# Patient Record
Sex: Female | Born: 1935 | Race: White | Hispanic: No | Marital: Married | Smoking: Former smoker
Health system: Southern US, Community
[De-identification: ages and names within clinical notes are randomized; demographics above are authoritative.]

## PROBLEM LIST (undated history)

## (undated) DIAGNOSIS — J449 Chronic obstructive pulmonary disease, unspecified: Secondary | ICD-10-CM

## (undated) DIAGNOSIS — M797 Fibromyalgia: Secondary | ICD-10-CM

## (undated) DIAGNOSIS — I4891 Unspecified atrial fibrillation: Secondary | ICD-10-CM

## (undated) DIAGNOSIS — K219 Gastro-esophageal reflux disease without esophagitis: Secondary | ICD-10-CM

## (undated) DIAGNOSIS — I10 Essential (primary) hypertension: Secondary | ICD-10-CM

## (undated) DIAGNOSIS — E119 Type 2 diabetes mellitus without complications: Secondary | ICD-10-CM

## (undated) DIAGNOSIS — F32A Depression, unspecified: Secondary | ICD-10-CM

## (undated) DIAGNOSIS — F329 Major depressive disorder, single episode, unspecified: Secondary | ICD-10-CM

## (undated) DIAGNOSIS — J441 Chronic obstructive pulmonary disease with (acute) exacerbation: Secondary | ICD-10-CM

## (undated) DIAGNOSIS — R413 Other amnesia: Secondary | ICD-10-CM

## (undated) DIAGNOSIS — H409 Unspecified glaucoma: Secondary | ICD-10-CM

## (undated) DIAGNOSIS — J4489 Other specified chronic obstructive pulmonary disease: Secondary | ICD-10-CM

## (undated) DIAGNOSIS — E7800 Pure hypercholesterolemia, unspecified: Secondary | ICD-10-CM

---

## 2015-06-10 ENCOUNTER — Other Ambulatory Visit: Payer: Self-pay | Admitting: Unknown Physician Specialty

## 2015-06-10 DIAGNOSIS — M1711 Unilateral primary osteoarthritis, right knee: Secondary | ICD-10-CM

## 2015-06-25 ENCOUNTER — Ambulatory Visit
Admission: RE | Admit: 2015-06-25 | Discharge: 2015-06-25 | Disposition: A | Discharge status: Home / Self Care | Payer: Medicare Other | Source: Ambulatory Visit | Attending: Unknown Physician Specialty | Admitting: Unknown Physician Specialty

## 2015-06-25 DIAGNOSIS — X58XXXA Exposure to other specified factors, initial encounter: Secondary | ICD-10-CM | POA: Diagnosis not present

## 2015-06-25 DIAGNOSIS — S83231A Complex tear of medial meniscus, current injury, right knee, initial encounter: Secondary | ICD-10-CM | POA: Diagnosis not present

## 2015-06-25 DIAGNOSIS — M1711 Unilateral primary osteoarthritis, right knee: Secondary | ICD-10-CM | POA: Diagnosis not present

## 2016-09-23 ENCOUNTER — Emergency Department: Payer: Medicare Other

## 2016-09-23 ENCOUNTER — Emergency Department
Admission: EM | Admit: 2016-09-23 | Discharge: 2016-09-23 | Disposition: A | Discharge status: Home / Self Care | Payer: Medicare Other | Attending: Emergency Medicine | Admitting: Emergency Medicine

## 2016-09-23 ENCOUNTER — Encounter: Payer: Self-pay | Admitting: *Deleted

## 2016-09-23 DIAGNOSIS — E119 Type 2 diabetes mellitus without complications: Secondary | ICD-10-CM | POA: Insufficient documentation

## 2016-09-23 DIAGNOSIS — I10 Essential (primary) hypertension: Secondary | ICD-10-CM | POA: Diagnosis not present

## 2016-09-23 DIAGNOSIS — S0990XA Unspecified injury of head, initial encounter: Secondary | ICD-10-CM

## 2016-09-23 DIAGNOSIS — Y9301 Activity, walking, marching and hiking: Secondary | ICD-10-CM | POA: Insufficient documentation

## 2016-09-23 DIAGNOSIS — S32592A Other specified fracture of left pubis, initial encounter for closed fracture: Secondary | ICD-10-CM | POA: Diagnosis not present

## 2016-09-23 DIAGNOSIS — S79912A Unspecified injury of left hip, initial encounter: Secondary | ICD-10-CM | POA: Diagnosis not present

## 2016-09-23 DIAGNOSIS — Y92129 Unspecified place in nursing home as the place of occurrence of the external cause: Secondary | ICD-10-CM | POA: Diagnosis not present

## 2016-09-23 DIAGNOSIS — W01198A Fall on same level from slipping, tripping and stumbling with subsequent striking against other object, initial encounter: Secondary | ICD-10-CM | POA: Insufficient documentation

## 2016-09-23 DIAGNOSIS — S4992XA Unspecified injury of left shoulder and upper arm, initial encounter: Secondary | ICD-10-CM | POA: Insufficient documentation

## 2016-09-23 DIAGNOSIS — Y999 Unspecified external cause status: Secondary | ICD-10-CM | POA: Insufficient documentation

## 2016-09-23 DIAGNOSIS — W19XXXA Unspecified fall, initial encounter: Secondary | ICD-10-CM

## 2016-09-23 HISTORY — DX: Essential (primary) hypertension: I10

## 2016-09-23 HISTORY — DX: Gastro-esophageal reflux disease without esophagitis: K21.9

## 2016-09-23 HISTORY — DX: Fibromyalgia: M79.7

## 2016-09-23 HISTORY — DX: Unspecified glaucoma: H40.9

## 2016-09-23 HISTORY — DX: Unspecified atrial fibrillation: I48.91

## 2016-09-23 HISTORY — DX: Chronic obstructive pulmonary disease with (acute) exacerbation: J44.1

## 2016-09-23 HISTORY — DX: Chronic obstructive pulmonary disease, unspecified: J44.9

## 2016-09-23 HISTORY — DX: Other amnesia: R41.3

## 2016-09-23 HISTORY — DX: Other specified chronic obstructive pulmonary disease: J44.89

## 2016-09-23 HISTORY — DX: Depression, unspecified: F32.A

## 2016-09-23 HISTORY — DX: Type 2 diabetes mellitus without complications: E11.9

## 2016-09-23 HISTORY — DX: Major depressive disorder, single episode, unspecified: F32.9

## 2016-09-23 HISTORY — DX: Pure hypercholesterolemia, unspecified: E78.00

## 2016-09-23 MED ORDER — ACETAMINOPHEN 500 MG PO TABS
1000.0000 mg | ORAL_TABLET | Freq: Once | ORAL | Status: AC
  Administered 2016-09-23: 1000 mg via ORAL
  Filled 2016-09-23: qty 2

## 2016-09-23 MED ORDER — OXYCODONE-ACETAMINOPHEN 5-325 MG PO TABS: 1.0000 | ORAL_TABLET | Freq: Four times a day (QID) | ORAL | 0 refills | Status: DC | PRN

## 2016-09-23 NOTE — ED Triage Notes (Signed)
Pt arrived to ED from [facility] after pt "lost balance" fell. Pt reports haivng hit left side of body and reports having left head, shoulder and hip pain. No LOC. Pt is alert and oriented x 4 .  Pt reprots ahving heard a crack in her hip, no shortening or rotation noted. Pt able to stand and ambulate to restroom upon arrival to ED.   Hx of memory loss per paperwork hx of HTN: BP 162/86  hx of diabetes:  CBG 119  DNR verbalized but could not be found by EMS.

## 2016-09-23 NOTE — ED Notes (Signed)
Patient transported to CT 

## 2016-09-23 NOTE — ED Notes (Signed)
Pt able to ambulate with walker but is having difficulty that walker does not roll or slide. Pt reports pain increases when ambulating but is also able to ambulate at this time with the assistance of the walker. Pt reports feeling steady with walker.

## 2016-09-23 NOTE — Care Management Note (Signed)
Case Management Note  Patient Details  Name: Denise PairKatie F Cervantes MRN: [id number] Date of Birth: 12-21-1925  Subjective/Objective:        Asked by MD to set up PT for patient at the facility. Spoke to AinsworthSusan at [facility] [phone]. She has said they have a PT service on site. The patient will need a face to face completed as well as a hard copy script for the P.T. Services.     Dr. Mayford KnifeWilliams has agreed to complete these for the patient.       Action/Plan:   Expected Discharge Date:                  Expected Discharge Plan:     In-House Referral:     Discharge planning Services     Post Acute Care Choice:    Choice offered to:     DME Arranged:    DME Agency:     HH Arranged:    HH Agency:     Status of Service:     If discussed at [employer] Length of Stay Meetings, dates discussed:    Additional Comments:  Berna BueCheryl Donnah Levert, RN 09/23/2016, 12:59 PM

## 2016-09-23 NOTE — ED Provider Notes (Signed)
[facility] Emergency Department Provider Note       Time seen: ----------------------------------------- 11:27 AM on 09/23/2016 -----------------------------------------     I have reviewed the triage vital signs and the nursing notes.   HISTORY   Chief Complaint Fall    HPI Denise Cervantes is a 83 y.o. female who presents to the ED for a fall while at independent living at [facility] legs. Patient reports to having hit the left side of her body reports having hit her head shoulder and hip. She denies loss of consciousness. She reports hearing a crack in her hip. She was able to stand and related to the restroom upon arrival to the ER. The event occurred just prior to arrival.   Past Medical History:  Diagnosis Date  . A-fib (HCC)   . Diabetes (HCC)   . Hypertension   . Memory loss     There are no active problems to display for this patient.   History reviewed. No pertinent surgical history.  Allergies Penicillins  Social History Social History  Substance Use Topics  . Smoking status: Never Smoker  . Smokeless tobacco: Never Used  . Alcohol use No    Review of Systems Constitutional: Negative for fever. Eyes: Negative for vision changes ENT:  Negative for congestion, sore throat Cardiovascular: Negative for chest pain. Respiratory: Negative for shortness of breath. Gastrointestinal: Negative for abdominal pain, vomiting and diarrhea. Genitourinary: Negative for dysuria. Musculoskeletal: Positive for left hip, shoulder pain Skin: Negative for rash. Neurological: Positive for headache  All systems negative/normal/unremarkable except as stated in the HPI  ____________________________________________   PHYSICAL EXAM:  VITAL SIGNS: ED Triage Vitals  Enc Vitals Group     BP --      Pulse --      Resp --      Temp 09/23/16 1121 98.2 F (36.8 C)     Temp Source 09/23/16 1121 Oral     SpO2 09/23/16 1120 98 %     Weight 09/23/16 1126  130 lb (59 kg)     Height 09/23/16 1126 5\' 2"  (1.575 m)     Head Circumference --      Peak Flow --      Pain Score 09/23/16 1121 5     Pain Loc --      Pain Edu? --      Excl. in GC? --     Constitutional: Alert and oriented. Well appearing and in no distress. Eyes: Conjunctivae are normal. PERRL. Normal extraocular movements. ENT   Head: Normocephalic, Left occipital hematoma   Nose: No congestion/rhinnorhea.   Mouth/Throat: Mucous membranes are moist.   Neck: No stridor. Cardiovascular: Normal rate, regular rhythm. No murmurs, rubs, or gallops. Respiratory: Normal respiratory effort without tachypnea nor retractions. Breath sounds are clear and equal bilaterally. No wheezes/rales/rhonchi. Gastrointestinal: Soft and nontender. Normal bowel sounds Musculoskeletal: Pain with range of motion of the left hip, left pelvic pain. Pain range of motion of left shoulder Neurologic:  Normal speech and language. No gross focal neurologic deficits are appreciated.  Skin:  Skin is warm, dry and intact. No rash noted. Psychiatric: Mood and affect are normal. Speech and behavior are normal.  ____________________________________________  EKG: Interpreted by me.Atrial fibrillation with a rate of 82 bpm, normal QRS, normal QT, normal axis.  ____________________________________________  ED COURSE:  Pertinent labs & imaging results that were available during my care of the patient were reviewed by me and considered in my medical decision making (see chart  for details). Patient presents for a fall, we will assess with labs and imaging as indicated. Clinical Course as of Sep 24 1246  Thu Sep 23, 2016  1241 Patient was discussed with orthopedics who states she is stable for outpatient follow-up if she can ambulate. She will need pain control and physical therapy evaluation which we can arrange at [facility].  [JW]    Clinical Course User Index [JW] Emily FilbertWilliams, Jonathan E, MD    Procedures ____________________________________________   RADIOLOGY Images were viewed by me  CT head, left shoulder x-rays, left hip x-rays  IMPRESSION: Mildly displaced fracture involving junction of pubic symphysis and left superior pubic ramus. ____________________________________________  FINAL ASSESSMENT AND PLAN  Fall, minor head injury, pelvic fracture  Plan: Patient's labs and imaging were dictated above. Patient had presented for a mechanical fall that occurred at [facility]. Patient does have a pelvic fracture but she is able to bear some weight. We did arrange for a walker and physical therapy at [facility]. She is stable for outpatient follow-up. X-ray findings were reviewed with orthopedics prior to discharge.   Emily FilbertWilliams, Jonathan E, MD   Note: This note was generated in part or whole with voice recognition software. Voice recognition is usually quite accurate but there are transcription errors that can and very often do occur. I apologize for any typographical errors that were not detected and corrected.     Emily FilbertWilliams, Jonathan E, MD 09/23/16 1249

## 2017-01-20 ENCOUNTER — Other Ambulatory Visit: Payer: Self-pay | Admitting: Orthopedic Surgery

## 2017-01-20 DIAGNOSIS — M5136 Other intervertebral disc degeneration, lumbar region: Secondary | ICD-10-CM

## 2017-02-01 ENCOUNTER — Ambulatory Visit
Admission: RE | Admit: 2017-02-01 | Discharge: 2017-02-01 | Disposition: A | Discharge status: Home / Self Care | Payer: Medicare Other | Source: Ambulatory Visit | Attending: Orthopedic Surgery | Admitting: Orthopedic Surgery

## 2017-02-01 DIAGNOSIS — M5136 Other intervertebral disc degeneration, lumbar region: Secondary | ICD-10-CM | POA: Insufficient documentation

## 2017-02-01 DIAGNOSIS — M48061 Spinal stenosis, lumbar region without neurogenic claudication: Secondary | ICD-10-CM | POA: Insufficient documentation

## 2017-02-01 DIAGNOSIS — M5416 Radiculopathy, lumbar region: Secondary | ICD-10-CM | POA: Diagnosis present

## 2017-05-31 ENCOUNTER — Other Ambulatory Visit: Payer: Self-pay

## 2017-05-31 ENCOUNTER — Ambulatory Visit: Payer: Medicare Other | Attending: Family Medicine | Admitting: Physical Therapy

## 2017-05-31 ENCOUNTER — Encounter: Payer: Self-pay | Admitting: Physical Therapy

## 2017-05-31 DIAGNOSIS — R262 Difficulty in walking, not elsewhere classified: Secondary | ICD-10-CM | POA: Insufficient documentation

## 2017-05-31 DIAGNOSIS — H8112 Benign paroxysmal vertigo, left ear: Secondary | ICD-10-CM

## 2017-05-31 DIAGNOSIS — R42 Dizziness and giddiness: Secondary | ICD-10-CM | POA: Diagnosis present

## 2017-05-31 NOTE — Therapy (Signed)
[facility] MAIN [facility] SERVICES [address] [place], [place], [address] Phone: [phone]   Fax:  [phone]  Physical Therapy Evaluation  Patient Details  Name: Denise Cervantes MRN: [id number] Date of Birth: 25-Aug-1925 No Data Recorded  Encounter Date: 05/31/2017  PT End of Session - 05/31/17 1223    Visit Number  1    Number of Visits  9    Date for PT Re-Evaluation  07/26/17    PT Start Time  1058    PT Stop Time  1209    PT Time Calculation (min)  71 min    Equipment Utilized During Treatment  Gait belt    Activity Tolerance  Patient tolerated treatment well    Behavior During Therapy  WFL for tasks assessed/performed       Past Medical History:  Diagnosis Date  . A-fib (HCC)   . Bronchitis, obstructive, chronic (HCC)   . COPD exacerbation (HCC)   . Depression   . Diabetes (HCC)    type II  . Esophageal reflux disease   . Fibromyalgia   . Glaucoma   . HBP (high blood pressure)   . Hypercholesterolemia   . Hypertension   . Memory loss     History reviewed. No pertinent surgical history.  There were no vitals filed for this visit.   Subjective Assessment - 06/01/17 1514    Subjective  Patient states that she had mild dizziness this morning when she went to get up out of bed and had to lie down a stay still until it went away.     Pertinent History  Patient difficult historian as she had a difficult time remembering at times and a difficult time staying on topic. Patient reports that she has had 3 falls this past year, but described 4 falls. Patient reports her first fall this past year was in May and states she fractured her pelvis from the fall.The second fall in December she was getting up and using rolling walker and the wheel got caught and she fell. Patient reports that she was dragging a table and she fell backwards in December. Patient reports that her dizziness first began in December after she suffered a fall onto her  right shoulder, hit her head and had a large bruise on her left buttock per patient report. Patient reports it was a witnessed fall and she did not lose consciousness.  Patient reports that she gets dizziness at times when she first gets up and that she uses the walker for balance at night when she needs to get up to use the restroom. Patient states she has a SPC that she keeps in the kitchen in case she needs it and reports that she frequently touches furniture and walls for support in her home. Patient reports that the dizziness is better as compared to when it first started. Patient states initially she went to see Dr. Burnadette Pop and he prescribed Meclizine. Patient states that the Meclizine helped and that she completed the prescription but that she is still having dizziness at times. Patient states the worse part of her dizziness was over by the time she finished taking the Meclizine. Patient reports that her right ear has been aching periodically and patient states this comes and goes. Patient reports she is getting vertigo and oscillopsia when getting into or out of bed at times of short duration. During visual gaze and Dix-Hallpike testing, patient reports that recently she began experiencing double vision with  left gaze and that she is seeing a dark spot in her right eye that moves when she moves her eye. When asked if she reported these issues to her eye doctor during her recent exam, patient reports that she did not discuss these issues. Patient reports that she has another eye exam next month and she is going to discuss the double vision and the dark spot at that time. Encouraged patient to discuss these items with her eye doctor. Patient states she did not score well on the last vision test so that is why she has another follow up to be tested again and see if it has changed any.    Diagnostic tests  none    Patient Stated Goals  to be able to do dressing, bathing and grooming tasks such as be able to  do her hair. Patient would like to be able to "lifting simple task"     Currently in Pain?  Yes    Pain Location  Shoulder    Pain Orientation  Right    Pain Descriptors / Indicators  Sore    Pain Type  Acute pain    Pain Onset  1 to 4 weeks ago         [facility] PT Assessment - 06/01/17 1521      Assessment   Medical Diagnosis  BPPV    Referring Provider  Dr. Burnadette Pop    Onset Date/Surgical Date  -- December 2018    Prior Therapy  no prior vestibular therapy      Precautions   Precautions  Fall      Restrictions   Weight Bearing Restrictions  No      Balance Screen   Has the patient fallen in the past 6 months  Yes    How many times?  4    Has the patient had a decrease in activity level because of a fear of falling?   Yes    Is the patient reluctant to leave their home because of a fear of falling?   No      Home Public house manager residence independent cottage at [facility] Arrangements  Spouse/significant other    Available Help at Discharge  Family;Friend(s)    Type of Home  House independent cottage at [facility] Access  Stairs to enter    Entrance Stairs-Number of Steps  2    Entrance Stairs-Rails  Right;Left;Can reach both    Home Layout  One level    Home Equipment  Grab bars - tub/shower;Walker - 2 wheels;Wheelchair - Copy - single point      Prior Function   Level of Independence  Independent with community mobility with device;Independent with household mobility without device    Vocation  Retired      Print production planner Gait Index      Dynamic Gait Index   Level Surface  Mild Impairment    Change in Gait Speed  Mild Impairment    Gait with Horizontal Head Turns  Normal    Gait with Vertical Head Turns  Mild Impairment    Gait and Pivot Turn  Normal    Step Over Obstacle  Moderate Impairment    Step Around Obstacles  Normal    Steps   Mild Impairment    Total Score  18        VESTIBULAR AND  BALANCE EVALUATION  Onset Date: December 2018 after a fall  HISTORY:  Subjective history of current problem: Patient difficult historian as she had a difficult time remembering at times and a difficult time staying on topic. Patient reports that she has had 3 falls this past year, but described 4 falls. Patient reports her first fall this past year was in May and states she fractured her pelvis from the fall.The second fall in December she was getting up and using rolling walker and the wheel got caught and she fell. Patient reports that she was dragging a table and she fell backwards in December. Patient reports that her dizziness first began in December after she suffered a fall onto her right shoulder, hit her head and had a large bruise on her left buttock per patient report. Patient reports it was a witnessed fall and she did not lose consciousness.  Patient reports that she gets dizziness at times when she first gets up and that she uses the walker for balance at night when she needs to get up to use the restroom. Patient states she has a SPC that she keeps in the kitchen in case she needs it and reports that she frequently touches furniture and walls for support in her home. Patient reports that the dizziness is better as compared to when it first started. Patient states initially she went to see Dr. Burnadette PopLinthavong and he prescribed Meclizine. Patient states that the Meclizine helped and that she completed the prescription but that she is still having dizziness at times. Patient states the worse part of her dizziness was over by the time she finished taking the Meclizine. Patient reports that her right ear has been aching periodically and patient states this comes and goes. Patient reports she is getting vertigo and oscillopsia when getting into or out of bed at times of short duration. During visual gaze and Dix-Hallpike testing, patient reports  that recently she began experiencing double vision with left gaze and that she is seeing a dark spot in her right eye that moves when she moves her eye. When asked if she reported these issues to her eye doctor during her recent exam, patient reports that she did not discuss these issues. Patient reports that she has another eye exam next month and she is going to discuss the double vision and the dark spot at that time. Encouraged patient to discuss these items with her eye doctor. Patient states she did not score well on the last vision test so that is why she has another follow up to be tested again and see if it has changed any.  Description of dizziness: vertigo, unsteadiness, falling, general unsteadiness and oscillopsia Frequency: currently patient reports she had dizziness this morning when she first got up this morning. Patient reports that when she gets up or lies down she gets dizziness.  Duration: lasts until she gets flat and lies still and it will go away quickly when she is still Symptom nature: motion provoked, positional  Provocative Factors: lying down and getting out of bed Easing Factors: staying still and lying down  Progression of symptoms: better  Falls (yes/no): yes Number of falls in past 6 months: fell in May and December; patient reports 2 falls in the past 6 months.   Auditory complaints (tinnitus, pain, drainage): Patient denies tinnitus and drainage from the ears and no recent changes in hearing.  Vision (last eye exam, diplopia, recent changes): glasses, states she went to the eye doctor last  week    EXAMINATION   SOMATOSENSORY:  Patient report that she has had a shot in her back by Dr. Yves Dill for leg pain and tingling which she states has helped.  Any N & T in extremities or weakness: currently denies       COORDINATION: Finger to Nose: TBA  Past Pointing:  TBA   MUSCULOSKELETAL SCREEN: Cervical Spine ROM: AROM cervical spine left rotation grossly 55  degrees and right rotation grossly 50-55 degrees with mild discomfort with right rotation.  WNL cervical AROM flexion and extension   Functional Mobility: Patient independent with bed mobility including rolling and supine to/from sitting and independent with sit to stand transfers.   Gait: Patient arrives to the clinic with [facility]. Patient initially used the cane to walk into the clinic but then picked it up and carried it.  Patient ambulates with toes pointed outward left more than right with fair gait speed with uneven steppage at times.  Scanning of visual environment with gait is: fair  Balance: Patient demonstrates difficulty with SLS, ambulation with vertical head turns, and with changes in gait speed. Unstable surfaces was not tested this date.  OCULOMOTOR / VESTIBULAR TESTING:  Oculomotor Exam- Room Light  Normal Abnormal Comments  Ocular Alignment N    Ocular ROM N    Spontaneous Nystagmus N    End-Gaze Nystagmus N    Smooth Pursuit  ABN Patient reports recently she began experiencing double vision with left gaze. Patient reports that she has another eye exam next month and she is going to discuss this at her follow-up visit. Patient with saccade eye movements with smooth pursuits testing.  Saccades N    VOR   Deferred secondary to + Dix-Hallpike testing  VOR Cancellation   Deferred secondary to + Dix-Hallpike testing  Left Head Thrust   Deferred secondary to + Dix-Hallpike testing  Right Head Thrust   Deferred secondary to + Dix-Hallpike testing  Head Shaking Nystagmus   Deferred secondary to + Dix-Hallpike testing     BPPV TESTS:  Symptoms Duration Intensity Nystagmus  L Dix-Hallpike vertigo Less than one minute; about 30 seconds 10/10 upbeating left torsional nystagmus of short duration  R Dix-Hallpike none n/a n/a none    FUNCTIONAL OUTCOME MEASURES:  Results Comments  DHI 70/100 Moderate perception of handicap; in need of intervention  ABC Scale 34.4% Falls risk; in  need of intervention  DGI 18/24 Falls risk; in need of intervention    Canalith Repositioning: On mat table, performed left Dix-Hallpike testing and it was positive with left upbeating torsional nystagmus of short duration with few second latency.  Performed left canalith repositioning maneuver (CRT).  Repeated left CRT for a total of 2 maneuvers with retesting between maneuvers. Patient reported 10/10 vertigo with first, reports "tinge" of vertigo with second maneuver.  Performed right Dix-Hallpike test and it was negative with no nystagmus observed and patient denied vertigo.     PT Education - 05/31/17 1222    Education provided  Yes    Education Details  Discussed BPPV and POC    Person(s) Educated  Patient    Methods  Explanation;Demonstration;Handout;Verbal cues    Comprehension  Verbalized understanding;Verbal cues required       PT Short Term Goals - 05/31/17 1227      PT SHORT TERM GOAL #1   Title  Patient will report 50% or greater improvement in her symptoms of dizziness and imbalance with provoking motions or positions.    Baseline  reports 10/10 vertigo with Left DH test on 05/31/17    Time  4    Period  Weeks    Status  New    Target Date  06/28/17      PT SHORT TERM GOAL #2   Title  Patient will reduce perceived disability to moderate levels as indicated by <70 on Dizziness Handicap Inventory [facility]) to demonstrate significant improvement in dizziness.    Baseline  scored 70/100 on 05/31/17    Time  4    Period  Weeks    Status  New        PT Long Term Goals - 05/31/17 1228      PT LONG TERM GOAL #1   Title  Patient will demonstrate reduced falls risk as evidenced by Dynamic Gait Index (DGI) >19/24.    Baseline  scored 18/24 on 05/31/17    Time  4    Period  Weeks    Status  New    Target Date  07/26/17      PT LONG TERM GOAL #2   Title  Patient will reduce perceived disability to low levels as indicated by <40 on Dizziness Handicap Inventory to  demonstrate significant improvement in dizziness.    Baseline  scored 70/100 on 05/31/17    Time  4    Period  Weeks    Status  New    Target Date  07/26/17      PT LONG TERM GOAL #3   Title  Patient will have demonstrate decreased falls risk as indicated by Activities Specific Balance Confidence Scale score of 67% or greater.    Baseline  scored 34.4% on 05/31/17    Time  4    Period  Weeks    Status  New    Target Date  07/26/17             Plan - 06/01/17 1528    Clinical Impression Statement  Patient noted to have positive left Dix-Hallpike test which could be indicative of L posterior canal BPPV. Performed 2 canalith repositioning manuevers this date which patient was able to tolerate well. Patient does report diplopia with visula field testing which she reports is a recent change. In addition, she demonstrates potential central sign of abnormal smooth pursuits as she had multiple saccadic eye movements in all fields with smooth pursuit testing. In addition, noted typical response initially with left Dix-Hallpike testing eye movements of left upbeating, torsional nystagmus of short duration then however, noted multiple downbeats which could indicate a central finding. Patient demonstrates listed deficits and would benefit from PT services to address vestibular deficits, balance issues and to try to reduce her falls risk.     History and Personal Factors relevant to plan of care:  multiple falls, patient reports anxiety, age, comorbidities-glaucome, memory loss, COPD, depression, HBP, DM    Clinical Presentation  Evolving    Clinical Decision Making  Moderate    Rehab Potential  Good    Clinical Impairments Affecting Rehab Potential  positive indicators: motivated, acute vestibular issue   negative indicators: age, memory loss    PT Frequency  1x / week    PT Duration  8 weeks    PT Treatment/Interventions  Vestibular;Canalith Repostioning;Gait training;Stair training;Therapeutic  activities;Therapeutic exercise;Balance training;Neuromuscular re-education;Patient/family education    PT Next Visit Plan  repeat L DH and CRT if indicated    Consulted and Agree with Plan of Care  Patient       Patient  will benefit from skilled therapeutic intervention in order to improve the following deficits and impairments:  Dizziness, Pain, Decreased strength, Decreased balance, Difficulty walking  Visit Diagnosis: BPPV (benign paroxysmal positional vertigo), left  Dizziness and giddiness  Difficulty in walking, not elsewhere classified     Problem List There are no active problems to display for this patient.  Mardelle Matte PT, DPT [phone] Mardelle Matte 05/31/2017, 12:33 PM  [facility] MAIN [facility] SERVICES [address] [place], [place], [address] Phone: [phone]   Fax:  [phone]  Name: CAMISHA SREY MRN: [id number] Date of Birth: 1926-02-09

## 2017-06-01 ENCOUNTER — Other Ambulatory Visit: Payer: Self-pay

## 2017-06-07 ENCOUNTER — Encounter: Payer: Medicare Other | Admitting: Physical Therapy

## 2017-06-10 ENCOUNTER — Ambulatory Visit: Payer: Medicare Other | Attending: Family Medicine

## 2017-06-10 DIAGNOSIS — H8112 Benign paroxysmal vertigo, left ear: Secondary | ICD-10-CM | POA: Insufficient documentation

## 2017-06-10 DIAGNOSIS — R42 Dizziness and giddiness: Secondary | ICD-10-CM | POA: Insufficient documentation

## 2017-06-10 DIAGNOSIS — R262 Difficulty in walking, not elsewhere classified: Secondary | ICD-10-CM | POA: Insufficient documentation

## 2017-06-14 ENCOUNTER — Encounter: Payer: Self-pay | Admitting: Physical Therapy

## 2017-06-14 ENCOUNTER — Ambulatory Visit: Payer: Medicare Other | Admitting: Physical Therapy

## 2017-06-14 DIAGNOSIS — R262 Difficulty in walking, not elsewhere classified: Secondary | ICD-10-CM

## 2017-06-14 DIAGNOSIS — R42 Dizziness and giddiness: Secondary | ICD-10-CM | POA: Diagnosis not present

## 2017-06-14 DIAGNOSIS — H8112 Benign paroxysmal vertigo, left ear: Secondary | ICD-10-CM | POA: Diagnosis present

## 2017-06-14 NOTE — Therapy (Signed)
[facility] [address] [place], [place], [address] Phone: [phone]   Fax:  [phone]  Physical Therapy Treatment  Patient Details  Name: Denise PairKatie F Martis MRN: [id number] Date of Birth: 10-11-1925 Referring Provider: Dr. Burnadette PopLinthavong   Encounter Date: 06/14/2017  PT End of Session - 06/14/17 1257    Visit Number  2    Number of Visits  9    Date for PT Re-Evaluation  07/26/17    PT Start Time  1259    PT Stop Time  1349    PT Time Calculation (min)  50 min    Equipment Utilized During Treatment  Gait belt    Activity Tolerance  Patient tolerated treatment well    Behavior During Therapy  [facility]       Past Medical History:  Diagnosis Date  . A-fib (HCC)   . Bronchitis, obstructive, chronic (HCC)   . COPD exacerbation (HCC)   . Depression   . Diabetes (HCC)    type II  . Esophageal reflux disease   . Fibromyalgia   . Glaucoma   . HBP (high blood pressure)   . Hypercholesterolemia   . Hypertension   . Memory loss     History reviewed. No pertinent surgical history.  There were no vitals filed for this visit.  Subjective Assessment - 06/14/17 1257    Subjective  Patient states she went to the eye doctor this morning and states she had her eyes dilated twice today. Patient states she told her eye doctor about the spot in her eye, but states she forgot to mention the double vision. Patient states she goes back to see the doctor on Thursday again because she states she is having a problem with the pressure in her eye. Patient states she was given eye drops to use for this week only to help address the eye pressure problem. Patient reports she missed her appointment last week secondary to the death of a family member and had to attend a funeral. Patient reports she has not had any further episodes of dizziness or vertigo since the Epley maneuver was performed during the PT evaluation. Patient  reports that her "balance is off still especially with turning" and she is concerned about falling. Patient states she is careful and tries to hold onto things. Patient states her balance is off today.    Pertinent History  Patient difficult historian as she had a difficult time remembering at times and a difficult time staying on topic. Patient reports that she has had 3 falls this past year, but described 4 falls. Patient reports her first fall this past year was in May and states she fractured her pelvis from the fall.The second fall in December she was getting up and using rolling walker and the wheel got caught and she fell. Patient reports that she was dragging a table and she fell backwards in December. Patient reports that her dizziness first began in December after she suffered a fall onto her right shoulder, hit her head and had a large bruise on her left buttock per patient report. Patient reports it was a witnessed fall and she did not lose consciousness.  Patient reports that she gets dizziness at times when she first gets up and that she uses the walker for balance at night when she needs to get up to use the restroom. Patient states she has a SPC that she keeps in the kitchen in  case she needs it and reports that she frequently touches furniture and walls for support in her home. Patient reports that the dizziness is better as compared to when it first started. Patient states initially she went to see Dr. Burnadette Pop and he prescribed Meclizine. Patient states that the Meclizine helped and that she completed the prescription but that she is still having dizziness at times. Patient states the worse part of her dizziness was over by the time she finished taking the Meclizine. Patient reports that her right ear has been aching periodically and patient states this comes and goes. Patient reports she is getting vertigo and oscillopsia when getting into or out of bed at times of short duration. During  visual gaze and Dix-Hallpike testing, patient reports that recently she began experiencing double vision with left gaze and that she is seeing a dark spot in her right eye that moves when she moves her eye. When asked if she reported these issues to her eye doctor during her recent exam, patient reports that she did not discuss these issues. Patient reports that she has another eye exam next month and she is going to discuss the double vision and the dark spot at that time. Encouraged patient to discuss these items with her eye doctor. Patient states she did not score well on the last vision test so that is why she has another follow up to be tested again and see if it has changed any.    Diagnostic tests  none    Patient Stated Goals  to be able to do dressing, bathing and grooming tasks such as be able to do her hair. Patient would like to be able to "lifting simple task"     Pain Onset  1 to 4 weeks ago       Neuromuscular Re-education:  Dix-Hallpike testing: On mat table, performed left Dix-Hallpike testing and it was negative with no nystagmus observed and patient denied dizziness.  On mat table, performed right Dix-Hallpike testing and it was negative with no nystagmus observed and patient denied dizziness.   Sit to Stands:  Patient educated as to proper form with sit to stand. Patient performed 10 reps sit to stand from armless chair with verbal cuing to control eccentric descent with CGA.  Patient able to perform but reports leg fatigue during last few repetitions.   Airex pad:  On firm surface and then on Airex pad, patient performed feet together progressions (progressed to feet together) and semi-tandem progressions with alternating lead leg with and without body turns and horizontal and vertical head turns multiple one minute reps. Patient initially had much difficulty with all progressions on Airex pad but improved with practice.  Patient denied dizziness but reports sensation of  imbalance.   Discussed safety precautions with performing HEP at home.  Demonstrated and discussed standing in corner with chair in front for safety and then patient demonstrated. Issued feet together and semi-tandem progressions with body turns, horizontal and vertical head turns as well as sit to stand exercise for home exercise program.     PT Education - 06/14/17 1257    Education provided  Yes    Education Details  provided patient with handout from [email or web address].org about BPPV; issued exercise slides 1,4,5 and 13 from vestibular powerpoint; discussed safety with doing HEP at home    Person(s) Educated  Patient    Methods  Explanation;Handout;Demonstration;Verbal cues    Comprehension  Verbalized understanding;Returned demonstration       PT Short  Term Goals - 05/31/17 1227      PT SHORT TERM GOAL #1   Title  Patient will report 50% or greater improvement in her symptoms of dizziness and imbalance with provoking motions or positions.    Baseline  reports 10/10 vertigo with Left DH test on 05/31/17    Time  4    Period  Weeks    Status  New    Target Date  06/28/17      PT SHORT TERM GOAL #2   Title  Patient will reduce perceived disability to moderate levels as indicated by <70 on Dizziness Handicap Inventory [facility]) to demonstrate significant improvement in dizziness.    Baseline  scored 70/100 on 05/31/17    Time  4    Period  Weeks    Status  New        PT Long Term Goals - 05/31/17 1228      PT LONG TERM GOAL #1   Title  Patient will demonstrate reduced falls risk as evidenced by Dynamic Gait Index (DGI) >19/24.    Baseline  scored 18/24 on 05/31/17    Time  4    Period  Weeks    Status  New    Target Date  07/26/17      PT LONG TERM GOAL #2   Title  Patient will reduce perceived disability to low levels as indicated by <40 on Dizziness Handicap Inventory to demonstrate significant improvement in dizziness.    Baseline  scored 70/100 on 05/31/17    Time  4    Period   Weeks    Status  New    Target Date  07/26/17      PT LONG TERM GOAL #3   Title  Patient will have demonstrate decreased falls risk as indicated by Activities Specific Balance Confidence Scale score of 67% or greater.    Baseline  scored 34.4% on 05/31/17    Time  4    Period  Weeks    Status  New    Target Date  07/26/17            Plan - 06/14/17 1258    Clinical Impression Statement  Patient with negative Dix-hallpike testing this date and reports that she has not had any further episodes of vertigo since the Epley maneuver was performed during the initial PT evaluation. Focused working on balance and strength deficits this date and issued home exercise program. Patient was challenged by narrow BOS and uneven surfaces this date but did improve with practice. Patient denied dizziness during session even when performing activities involving body and head turning. Patient would benefit from continued PT services with emphasis on balance and strength training.     Rehab Potential  Good    Clinical Impairments Affecting Rehab Potential  positive indicators: motivated, acute vestibular issue   negative indicators: age, memory loss    PT Frequency  1x / week    PT Duration  8 weeks    PT Treatment/Interventions  Vestibular;Canalith Repostioning;Gait training;Stair training;Therapeutic activities;Therapeutic exercise;Balance training;Neuromuscular re-education;Patient/family education    PT Next Visit Plan  repeat L DH and CRT if indicated    Consulted and Agree with Plan of Care  Patient       Patient will benefit from skilled therapeutic intervention in order to improve the following deficits and impairments:  Dizziness, Pain, Decreased strength, Decreased balance, Difficulty walking  Visit Diagnosis: BPPV (benign paroxysmal positional vertigo), left  Dizziness and giddiness  Difficulty in walking, not elsewhere classified     Problem List There are no active problems to  display for this patient.  Mardelle Matte PT, DPT [phone] Mardelle Matte 06/14/2017, 3:42 PM  [facility] MAIN [facility] SERVICES [address] [place], [place], [address] Phone: [phone]   Fax:  [phone]  Name: SOLANGEL MCMANAWAY MRN: [id number] Date of Birth: [birth year]

## 2017-06-17 ENCOUNTER — Ambulatory Visit: Payer: Medicare Other | Admitting: Physical Therapy

## 2017-06-17 ENCOUNTER — Encounter: Payer: Self-pay | Admitting: Physical Therapy

## 2017-06-17 DIAGNOSIS — H8112 Benign paroxysmal vertigo, left ear: Secondary | ICD-10-CM

## 2017-06-17 DIAGNOSIS — R262 Difficulty in walking, not elsewhere classified: Secondary | ICD-10-CM

## 2017-06-17 DIAGNOSIS — R42 Dizziness and giddiness: Secondary | ICD-10-CM

## 2017-06-17 NOTE — Therapy (Signed)
[facility] MAIN [facility] SERVICES [address] [place], [place], [address] Phone: [phone]   Fax:  [phone]  Physical Therapy Treatment  Patient Details  Name: Denise Cervantes MRN: [id number] Date of Birth: April 02, 1926 Referring Provider: Dr. Burnadette Pop   Encounter Date: 06/17/2017  PT End of Session - 06/17/17 1301    Visit Number  3    Number of Visits  9    Date for PT Re-Evaluation  07/26/17    PT Start Time  1305    PT Stop Time  1355    PT Time Calculation (min)  50 min    Equipment Utilized During Treatment  Gait belt    Activity Tolerance  Patient tolerated treatment well    Behavior During Therapy  [facility] for tasks assessed/performed       Past Medical History:  Diagnosis Date  . A-fib (HCC)   . Bronchitis, obstructive, chronic (HCC)   . COPD exacerbation (HCC)   . Depression   . Diabetes (HCC)    type II  . Esophageal reflux disease   . Fibromyalgia   . Glaucoma   . HBP (high blood pressure)   . Hypercholesterolemia   . Hypertension   . Memory loss     History reviewed. No pertinent surgical history.  There were no vitals filed for this visit.  Subjective Assessment - 06/17/17 1301    Subjective  Patient states that she had an added eye medication for increased eye pressure but patient unable to recall the name of the medicine. Patient states that she mentioned the double vision to the eye doctor and states that he did not know anything about it and was not concerned about it. Patient reports no episodes of dizziness but states she is still imbalanced. Patient states that she has been trying to do the exercises at home. Patient states that she wants to be able to go walking again for exercise and to have better balance so that she does not have falls.     Pertinent History  Patient difficult historian as she had a difficult time remembering at times and a difficult time staying on topic. Patient reports that she has had 3  falls this past year, but described 4 falls. Patient reports her first fall this past year was in May and states she fractured her pelvis from the fall.The second fall in December she was getting up and using rolling walker and the wheel got caught and she fell. Patient reports that she was dragging a table and she fell backwards in December. Patient reports that her dizziness first began in December after she suffered a fall onto her right shoulder, hit her head and had a large bruise on her left buttock per patient report. Patient reports it was a witnessed fall and she did not lose consciousness.  Patient reports that she gets dizziness at times when she first gets up and that she uses the walker for balance at night when she needs to get up to use the restroom. Patient states she has a SPC that she keeps in the kitchen in case she needs it and reports that she frequently touches furniture and walls for support in her home. Patient reports that the dizziness is better as compared to when it first started. Patient states initially she went to see Dr. Burnadette Pop and he prescribed Meclizine. Patient states that the Meclizine helped and that she completed the prescription but that she is still having dizziness  at times. Patient states the worse part of her dizziness was over by the time she finished taking the Meclizine. Patient reports that her right ear has been aching periodically and patient states this comes and goes. Patient reports she is getting vertigo and oscillopsia when getting into or out of bed at times of short duration. During visual gaze and Dix-Hallpike testing, patient reports that recently she began experiencing double vision with left gaze and that she is seeing a dark spot in her right eye that moves when she moves her eye. When asked if she reported these issues to her eye doctor during her recent exam, patient reports that she did not discuss these issues. Patient reports that she has another  eye exam next month and she is going to discuss the double vision and the dark spot at that time. Encouraged patient to discuss these items with her eye doctor. Patient states she did not score well on the last vision test so that is why she has another follow up to be tested again and see if it has changed any.    Diagnostic tests  none    Patient Stated Goals  to be able to do dressing, bathing and grooming tasks such as be able to do her hair. Patient would like to be able to "lifting simple task"     Currently in Pain?  No/denies    Pain Onset  1 to 4 weeks ago       Neuromuscular Re-education:  Reviewed feet together with body turns and head turns and semi-tandem stance with head turns on firm and foam.  Step Taps: On firm surface, patient performed alternating step taps on 6' wooden step about 10 each foot. Patient able to perform without LOB.  Repeated activity while standing on Airex pad about 15 reps each foot. Patient had several small losses of balance where patient needed to touch // bar for support.   Airex balance beam: On Airex balance beam, performed sideways stepping 5' times 6 reps with CGA with verbal cues to stand erect as patient tends to stoop forward at the waist.  Tandem walking: Patient performed tandem walking 8' times 4 reps. Patient initially stepped off line several times but improved with practice.   Slow Marching: Patient performed on firm surface slow marching with 5 second holds 15 reps each leg with CGA.  Patient reaching to intermittently touch for balance several times.  Therapeutic Exercise:  Mini-squats: Patient performed 15 reps mini-squats with 5 second holds with verbal cues to maintain upright posture.  Sit to Stands:  Patient performed 10 reps sit to stand from armless chair with Airex pad placed under feet. Patient demonstrating good control with eccentric descent.  Patient struggled towards the last few reps secondary to leg fatigue and  had to rock at times to get momentum to stand, but did so without use of UEs.   PT Education - 06/17/17 1301    Education provided  Yes    Education Details  reviewed HEP; added slow marching and mini-squats to home exercise program    Person(s) Educated  Patient    Methods  Explanation;Demonstration;Handout;Verbal cues    Comprehension  Verbalized understanding;Returned demonstration       PT Short Term Goals - 05/31/17 1227      PT SHORT TERM GOAL #1   Title  Patient will report 50% or greater improvement in her symptoms of dizziness and imbalance with provoking motions or positions.    Baseline  reports 10/10 vertigo with Left DH test on 05/31/17    Time  4    Period  Weeks    Status  New    Target Date  06/28/17      PT SHORT TERM GOAL #2   Title  Patient will reduce perceived disability to moderate levels as indicated by <70 on Dizziness Handicap Inventory [facility]) to demonstrate significant improvement in dizziness.    Baseline  scored 70/100 on 05/31/17    Time  4    Period  Weeks    Status  New        PT Long Term Goals - 05/31/17 1228      PT LONG TERM GOAL #1   Title  Patient will demonstrate reduced falls risk as evidenced by Dynamic Gait Index (DGI) >19/24.    Baseline  scored 18/24 on 05/31/17    Time  4    Period  Weeks    Status  New    Target Date  07/26/17      PT LONG TERM GOAL #2   Title  Patient will reduce perceived disability to low levels as indicated by <40 on Dizziness Handicap Inventory to demonstrate significant improvement in dizziness.    Baseline  scored 70/100 on 05/31/17    Time  4    Period  Weeks    Status  New    Target Date  07/26/17      PT LONG TERM GOAL #3   Title  Patient will have demonstrate decreased falls risk as indicated by Activities Specific Balance Confidence Scale score of 67% or greater.    Baseline  scored 34.4% on 05/31/17    Time  4    Period  Weeks    Status  New    Target Date  07/26/17            Plan -  06/17/17 1301    Clinical Impression Statement  Patient continues to report no further episodes of vertigo since CRT performed at time of the initial evaluation. Reviewed home exercise program and patient demonstrating good technique. Worked on progressions of balance and strengthening activties this date. Patient challenged by compliant surfaces and single leg stance activities. Patient would benefit from continued PT services to further address functional deficits and to try to reduce falls risk.      Rehab Potential  Good    Clinical Impairments Affecting Rehab Potential  positive indicators: motivated, acute vestibular issue   negative indicators: age, memory loss    PT Frequency  1x / week    PT Duration  8 weeks    PT Treatment/Interventions  Vestibular;Canalith Repostioning;Gait training;Stair training;Therapeutic activities;Therapeutic exercise;Balance training;Neuromuscular re-education;Patient/family education    PT Next Visit Plan  cone tapping, soccer ball rolls, Nustep, sidestepping on 6" wooden step    PT Home Exercise Plan  semi-tandem progressions and feet togther on firm and pillow with and without body turns and horizontal and vertical head turns, sit to stands, slow marching, slow marching    Consulted and Agree with Plan of Care  Patient       Patient will benefit from skilled therapeutic intervention in order to improve the following deficits and impairments:  Dizziness, Pain, Decreased strength, Decreased balance, Difficulty walking  Visit Diagnosis: BPPV (benign paroxysmal positional vertigo), left  Dizziness and giddiness  Difficulty in walking, not elsewhere classified     Problem List There are no active problems to display for this patient.   Isaias Dowson  06/17/2017, 2:58 PM  [facility] MAIN [facility] SERVICES [address] [place], [place], [address] Phone: [phone]   Fax:  [phone]  Name: Denise Cervantes MRN:  [id number] Date of Birth: 02/16/1926

## 2017-06-21 ENCOUNTER — Encounter: Payer: Self-pay | Admitting: Physical Therapy

## 2017-06-21 ENCOUNTER — Ambulatory Visit: Payer: Medicare Other | Admitting: Physical Therapy

## 2017-06-21 DIAGNOSIS — R42 Dizziness and giddiness: Secondary | ICD-10-CM | POA: Diagnosis not present

## 2017-06-21 DIAGNOSIS — R262 Difficulty in walking, not elsewhere classified: Secondary | ICD-10-CM

## 2017-06-21 NOTE — Therapy (Signed)
[facility]IN [facility] [address] , [place], [address] Phone: [phone]   Fax:  [phone]  Physical Therapy Treatment  Patient Details  Name: Denise PairKatie F Cervantes MRN: [id number] Date of Birth: 1925/10/13 Referring Provider: Dr. Burnadette PopLinthavong   Encounter Date: 06/21/2017  PT End of Session - 06/21/17 1105    Visit Number  4    Number of Visits  9    Date for PT Re-Evaluation  07/26/17    PT Start Time  1105    PT Stop Time  1150    PT Time Calculation (min)  45 min    Equipment Utilized During Treatment  Gait belt    Activity Tolerance  Patient tolerated treatment well    Behavior During Therapy  [facility] for tasks assessed/performed       Past Medical History:  Diagnosis Date  . A-fib (HCC)   . Bronchitis, obstructive, chronic (HCC)   . COPD exacerbation (HCC)   . Depression   . Diabetes (HCC)    type II  . Esophageal reflux disease   . Fibromyalgia   . Glaucoma   . HBP (high blood pressure)   . Hypercholesterolemia   . Hypertension   . Memory loss     History reviewed. No pertinent surgical history.  There were no vitals filed for this visit.  Subjective Assessment - 06/21/17 1104    Subjective  Patient reports increased life stressors this past week. Patient reports she has not been able to do her exercises the last few days. Patient states she had several medical appointments yesterday and then her refridgerator broke and she had to move all of her food to another fridge.     Pertinent History  Patient difficult historian as she had a difficult time remembering at times and a difficult time staying on topic. Patient reports that she has had 3 falls this past year, but described 4 falls. Patient reports her first fall this past year was in May and states she fractured her pelvis from the fall.The second fall in December she was getting up and using rolling walker and the wheel got caught and she fell. Patient reports  that she was dragging a table and she fell backwards in December. Patient reports that her dizziness first began in December after she suffered a fall onto her right shoulder, hit her head and had a large bruise on her left buttock per patient report. Patient reports it was a witnessed fall and she did not lose consciousness.  Patient reports that she gets dizziness at times when she first gets up and that she uses the walker for balance at night when she needs to get up to use the restroom. Patient states she has a SPC that she keeps in the kitchen in case she needs it and reports that she frequently touches furniture and walls for support in her home. Patient reports that the dizziness is better as compared to when it first started. Patient states initially she went to see Dr. Burnadette PopLinthavong and he prescribed Meclizine. Patient states that the Meclizine helped and that she completed the prescription but that she is still having dizziness at times. Patient states the worse part of her dizziness was over by the time she finished taking the Meclizine. Patient reports that her right ear has been aching periodically and patient states this comes and goes. Patient reports she is getting vertigo and oscillopsia when getting into or out of bed at times  of short duration. During visual gaze and Dix-Hallpike testing, patient reports that recently she began experiencing double vision with left gaze and that she is seeing a dark spot in her right eye that moves when she moves her eye. When asked if she reported these issues to her eye doctor during her recent exam, patient reports that she did not discuss these issues. Patient reports that she has another eye exam next month and she is going to discuss the double vision and the dark spot at that time. Encouraged patient to discuss these items with her eye doctor. Patient states she did not score well on the last vision test so that is why she has another follow up to be tested  again and see if it has changed any.    Diagnostic tests  none    Patient Stated Goals  to be able to do dressing, bathing and grooming tasks such as be able to do her hair. Patient would like to be able to "lifting simple task"     Currently in Pain?  Yes    Pain Score  0-No pain    Pain Location  Head states had a headache earlier    Pain Orientation  Posterior    Pain Onset  1 to 4 weeks ago        Neuromuscular Re-education:  Step Ups: Patient performed step up and return on 6" wooden step without upper extremity support multiple reps, approximately 10 reps.  Patient performed 5 reps sideways step up over and return without upper extremity support.  Repeated step up, over and backward return on Airex pad multiple reps.  Patient performed step ups on 6" wooden box 10 reps without upper extremity support. Patient required min A twice to correct losses of balance and patient was able to self-correct several smaller losses of balance as well.  Performed side stepping up onto box and back down 10 reps each side.    Ball circles: Worked on standing with one foot on ball while doing clockwise rolls multiple reps each foot with faded UEs support. Repeated exercise doing forward/backward movement on ball.  Performed multiple sets of this exercise. Patient progressed from bilateral UEs support to UE support of one hand on // bar. Patient with anterior/posterior swaying with this activity and occasional loss of balance posterolaterally to the right with assistance ranging from CGA to Mod A to correct loss of balance.    8 reps sidestepping onto over and return on 6" wooden steps 10 reps step up and back       PT Education - 06/21/17 1104    Education provided  Yes    Person(s) Educated  Patient    Methods  Explanation    Comprehension  Verbalized understanding       PT Short Term Goals - 06/21/17 1105      PT SHORT TERM GOAL #1   Title  Patient will report 50% or greater  improvement in her symptoms of dizziness and imbalance with provoking motions or positions.    Baseline  reports 10/10 vertigo with Left DH test on 05/31/17    Time  4    Period  Weeks    Status  Achieved      PT SHORT TERM GOAL #2   Title  Patient will reduce perceived disability to moderate levels as indicated by <70 on Dizziness Handicap Inventory [facility]) to demonstrate significant improvement in dizziness.    Baseline  scored 70/100 on 05/31/17  Time  4    Period  Weeks    Status  On-going        PT Long Term Goals - 06/21/17 1105      PT LONG TERM GOAL #1   Title  Patient will demonstrate reduced falls risk as evidenced by Dynamic Gait Index (DGI) >19/24.    Baseline  scored 18/24 on 05/31/17    Time  4    Period  Weeks    Status  On-going      PT LONG TERM GOAL #2   Title  Patient will reduce perceived disability to low levels as indicated by <40 on Dizziness Handicap Inventory to demonstrate significant improvement in dizziness.    Baseline  scored 70/100 on 05/31/17    Time  4    Period  Weeks    Status  On-going      PT LONG TERM GOAL #3   Title  Patient will have demonstrate decreased falls risk as indicated by Activities Specific Balance Confidence Scale score of 67% or greater.    Baseline  scored 34.4% on 05/31/17    Time  4    Period  Weeks    Status  On-going            Plan - 06/21/17 1105    Clinical Impression Statement  Patient continues to improve and was able to add balance progressions to treatment session. Patient encouraged to continue to try to do her home exercise program. Patient improved with practice cone tapping exercise and was able to progress to Airex pad. Patient would benefit from continued PT services to further address deficits and goals.       Rehab Potential  Good    Clinical Impairments Affecting Rehab Potential  positive indicators: motivated, acute vestibular issue   negative indicators: age, memory loss    PT Frequency  1x / week     PT Duration  8 weeks    PT Treatment/Interventions  Vestibular;Canalith Repostioning;Gait training;Stair training;Therapeutic activities;Therapeutic exercise;Balance training;Neuromuscular re-education;Patient/family education    PT Next Visit Plan  NuStep, soccer ball rolls, leg press    PT Home Exercise Plan  semi-tandem progressions and feet togther on firm and pillow with and without body turns and horizontal and vertical head turns, sit to stands, slow marching, slow marching    Consulted and Agree with Plan of Care  Patient       Patient will benefit from skilled therapeutic intervention in order to improve the following deficits and impairments:  Dizziness, Pain, Decreased strength, Decreased balance, Difficulty walking  Visit Diagnosis: Dizziness and giddiness  Difficulty in walking, not elsewhere classified     Problem List There are no active problems to display for this patient.  Mardelle Matte PT, DPT [phone] Mardelle Matte 06/21/2017, 4:04 PM  [place] MAIN [facility] SERVICES [address] [place], [place], [address] Phone: [phone]   Fax:  [phone]  Name: Denise Cervantes MRN: [id number] Date of Birth: November 01, 1925

## 2017-06-28 ENCOUNTER — Ambulatory Visit: Payer: Medicare Other | Admitting: Physical Therapy

## 2017-07-05 ENCOUNTER — Encounter: Payer: Self-pay | Admitting: Physical Therapy

## 2017-07-05 ENCOUNTER — Ambulatory Visit: Payer: Medicare Other | Admitting: Physical Therapy

## 2017-07-05 DIAGNOSIS — R42 Dizziness and giddiness: Secondary | ICD-10-CM | POA: Diagnosis not present

## 2017-07-05 DIAGNOSIS — R262 Difficulty in walking, not elsewhere classified: Secondary | ICD-10-CM

## 2017-07-05 NOTE — Therapy (Signed)
[place] [facility] [address] [place], [place], [address] Phone: [phone]   Fax:  [phone]  Physical Therapy Treatment  Patient Details  Name: Denise PairKatie F Cervantes MRN: [id number] Date of Birth: 02/26/1926 Referring Provider: Dr. Burnadette PopLinthavong   Encounter Date: 07/05/2017  PT End of Session - 07/05/17 1310    Visit Number  5    Number of Visits  9    Date for PT Re-Evaluation  07/26/17    PT Start Time  1303    PT Stop Time  1354    PT Time Calculation (min)  51 min    Equipment Utilized During Treatment  Gait belt    Activity Tolerance  Patient tolerated treatment well    Behavior During Therapy  [facility] for tasks assessed/performed       Past Medical History:  Diagnosis Date  . A-fib (HCC)   . Bronchitis, obstructive, chronic (HCC)   . COPD exacerbation (HCC)   . Depression   . Diabetes (HCC)    type II  . Esophageal reflux disease   . Fibromyalgia   . Glaucoma   . HBP (high blood pressure)   . Hypercholesterolemia   . Hypertension   . Memory loss     History reviewed. No pertinent surgical history.  There were no vitals filed for this visit.  Subjective Assessment - 07/05/17 1306    Subjective  Patient reports that she has not had any exercise in the past 2 weeks. Patient states the bone in her left foot has been bothering her and states it has been sore after therapy. Patient reports that she has been getting soreness in her legs that lasts a few days after therapy. Patient states she had one episode of vertigo last week when she went to get up in the morning. Patient states she had vertigo that improved, but states during that day she was having episodes of imbalance. Patient denies falls. Patient states she has not had any further episodes of vertigo or dizziness.  Patient states she went to Dr. Sherryll BurgerLinthavong's office due to her foot soreness and vertigo issues. Patient reports she was prescribed meclizine. Patient  reports she has been taking the Meclizine 3 times a day. Patient states she feels she might have had a sinus infection last week.       Pertinent History  Patient difficult historian as she had a difficult time remembering at times and a difficult time staying on topic. Patient reports that she has had 3 falls this past year, but described 4 falls. Patient reports her first fall this past year was in May and states she fractured her pelvis from the fall.The second fall in December she was getting up and using rolling walker and the wheel got caught and she fell. Patient reports that she was dragging a table and she fell backwards in December. Patient reports that her dizziness first began in December after she suffered a fall onto her right shoulder, hit her head and had a large bruise on her left buttock per patient report. Patient reports it was a witnessed fall and she did not lose consciousness.  Patient reports that she gets dizziness at times when she first gets up and that she uses the walker for balance at night when she needs to get up to use the restroom. Patient states she has a SPC that she keeps in the kitchen in case she needs it and reports that she frequently touches furniture  and walls for support in her home. Patient reports that the dizziness is better as compared to when it first started. Patient states initially she went to see Dr. Burnadette Pop and he prescribed Meclizine. Patient states that the Meclizine helped and that she completed the prescription but that she is still having dizziness at times. Patient states the worse part of her dizziness was over by the time she finished taking the Meclizine. Patient reports that her right ear has been aching periodically and patient states this comes and goes. Patient reports she is getting vertigo and oscillopsia when getting into or out of bed at times of short duration. During visual gaze and Dix-Hallpike testing, patient reports that recently she  began experiencing double vision with left gaze and that she is seeing a dark spot in her right eye that moves when she moves her eye. When asked if she reported these issues to her eye doctor during her recent exam, patient reports that she did not discuss these issues. Patient reports that she has another eye exam next month and she is going to discuss the double vision and the dark spot at that time. Encouraged patient to discuss these items with her eye doctor. Patient states she did not score well on the last vision test so that is why she has another follow up to be tested again and see if it has changed any.    Diagnostic tests  none    Patient Stated Goals  to be able to do dressing, bathing and grooming tasks such as be able to do her hair. Patient would like to be able to "lifting simple task"     Pain Onset  1 to 4 weeks ago        Neuromuscular Re-education:  Performed left and right Dix-Hallpike tests and left and right head roll test and all were negative with no nystagmus observed and patient denied symptoms of vertigo.  Cone tapping: In standing on Airex pad, patient performed foot tapping to cones in series of two and three cones as called out by therapist.  Patient with multiple small losses of balance where she needed to reach // bars for support.  Patient requiring assistance ranging from CGA to Min A at times due to loss of balance.   Tandem Walking: Performed 6 reps of 5' tandem walking on firm surface with CGA. Patient had several times where she needed to step of the line to regain balance.   Walking while scanning for visual targets: Performed forwards and retroambulation at self-selected speed while scanning for visual targets (playing cards) in hallway with CGA to Min A 170' trial of each. Patient had multiple episodes of veering and was mostly able to regain balance on her own but did have a few times where she needed min A to regain balance.  Patient reports this  activity was tiring on her legs. Patient states she did not experience any vertigo or dizziness but states she felt imbalanced.   Therapeutic Exercise: Leg Press: Patient performed leg press machine #90 pounds 2 sets of 10 reps with verbal cue to not lock knees in extension and for breathing.  Patient reports fatigue in legs with this activity.     PT Education - 07/05/17 1309    Education provided  Yes    Education Details  reemphasized importance of HEP and discussed plan of care; recommended continued use of SPC-patient arrived using SPC this date.    Person(s) Educated  Patient  Methods  Explanation    Comprehension  Verbalized understanding       PT Short Term Goals - 06/21/17 1105      PT SHORT TERM GOAL #1   Title  Patient will report 50% or greater improvement in her symptoms of dizziness and imbalance with provoking motions or positions.    Baseline  reports 10/10 vertigo with Left DH test on 05/31/17    Time  4    Period  Weeks    Status  Achieved      PT SHORT TERM GOAL #2   Title  Patient will reduce perceived disability to moderate levels as indicated by <70 on Dizziness Handicap Inventory [facility]) to demonstrate significant improvement in dizziness.    Baseline  scored 70/100 on 05/31/17    Time  4    Period  Weeks    Status  On-going        PT Long Term Goals - 06/21/17 1105      PT LONG TERM GOAL #1   Title  Patient will demonstrate reduced falls risk as evidenced by Dynamic Gait Index (DGI) >19/24.    Baseline  scored 18/24 on 05/31/17    Time  4    Period  Weeks    Status  On-going      PT LONG TERM GOAL #2   Title  Patient will reduce perceived disability to low levels as indicated by <40 on Dizziness Handicap Inventory to demonstrate significant improvement in dizziness.    Baseline  scored 70/100 on 05/31/17    Time  4    Period  Weeks    Status  On-going      PT LONG TERM GOAL #3   Title  Patient will have demonstrate decreased falls risk as  indicated by Activities Specific Balance Confidence Scale score of 67% or greater.    Baseline  scored 34.4% on 05/31/17    Time  4    Period  Weeks    Status  On-going            Plan - 07/05/17 1310    Clinical Impression Statement  Patient reports that she had one isolated incident of vertigo last week when first getting out of bed. Repeated Dix-Hallpike and head roll tests and all were negative with no nystagmus observed and patient denied symptoms. Patient encouraged to try to do HEP consistently as patient reports that she did not feel well last week and has not exercisesd much in the past 2 weeks. Encouragement and education provided. Patient had increased imbalance this date and was challenged by tandem walking and cone tapping. Patient would benefit from continued PT services to try to improve balance and strengthening and to try to decrease falls risk.     Rehab Potential  Good    Clinical Impairments Affecting Rehab Potential  positive indicators: motivated, acute vestibular issue   negative indicators: age, memory loss    PT Frequency  1x / week    PT Duration  8 weeks    PT Treatment/Interventions  Vestibular;Canalith Repostioning;Gait training;Stair training;Therapeutic activities;Therapeutic exercise;Balance training;Neuromuscular re-education;Patient/family education    PT Next Visit Plan  NuStep, soccer ball rolls, leg press, balance progressions    PT Home Exercise Plan  semi-tandem progressions and feet togther on firm and pillow with and without body turns and horizontal and vertical head turns, sit to stands, slow marching, slow marching    Consulted and Agree with Plan of Care  Patient  Patient will benefit from skilled therapeutic intervention in order to improve the following deficits and impairments:  Dizziness, Pain, Decreased strength, Decreased balance, Difficulty walking  Visit Diagnosis: Dizziness and giddiness  Difficulty in walking, not elsewhere  classified     Problem List There are no active problems to display for this patient.  Mardelle Matte PT, DPT [phone] Mardelle Matte 07/05/2017, 2:31 PM  [facility] MAIN [facility] SERVICES [address] [place], [place], [address] Phone: [phone]   Fax:  [phone]  Name: Denise Cervantes MRN: [id number] Date of Birth: March 29, 1926

## 2017-07-12 ENCOUNTER — Other Ambulatory Visit: Payer: Self-pay | Admitting: Family Medicine

## 2017-07-12 ENCOUNTER — Ambulatory Visit
Admission: RE | Admit: 2017-07-12 | Discharge: 2017-07-12 | Disposition: A | Discharge status: Home / Self Care | Payer: Medicare Other | Source: Ambulatory Visit | Attending: Family Medicine | Admitting: Family Medicine

## 2017-07-12 ENCOUNTER — Ambulatory Visit: Payer: Commercial Managed Care - PPO | Admitting: Physical Therapy

## 2017-07-12 DIAGNOSIS — R519 Headache, unspecified: Secondary | ICD-10-CM

## 2017-07-12 DIAGNOSIS — I6782 Cerebral ischemia: Secondary | ICD-10-CM | POA: Insufficient documentation

## 2017-07-12 DIAGNOSIS — R42 Dizziness and giddiness: Secondary | ICD-10-CM | POA: Diagnosis present

## 2017-07-12 DIAGNOSIS — R410 Disorientation, unspecified: Secondary | ICD-10-CM

## 2017-07-12 DIAGNOSIS — R51 Headache: Secondary | ICD-10-CM

## 2017-07-12 DIAGNOSIS — F99 Mental disorder, not otherwise specified: Secondary | ICD-10-CM | POA: Diagnosis not present

## 2017-07-14 ENCOUNTER — Ambulatory Visit: Payer: Commercial Managed Care - PPO

## 2017-07-14 ENCOUNTER — Other Ambulatory Visit: Payer: Medicare Other

## 2017-07-15 ENCOUNTER — Other Ambulatory Visit: Payer: Self-pay | Admitting: Family Medicine

## 2017-07-15 DIAGNOSIS — G44209 Tension-type headache, unspecified, not intractable: Secondary | ICD-10-CM

## 2017-07-18 ENCOUNTER — Ambulatory Visit
Admission: RE | Admit: 2017-07-18 | Discharge: 2017-07-18 | Disposition: A | Discharge status: Home / Self Care | Payer: Medicare Other | Source: Ambulatory Visit | Attending: Family Medicine | Admitting: Family Medicine

## 2017-07-18 DIAGNOSIS — I639 Cerebral infarction, unspecified: Secondary | ICD-10-CM | POA: Insufficient documentation

## 2017-07-18 DIAGNOSIS — G44209 Tension-type headache, unspecified, not intractable: Secondary | ICD-10-CM

## 2017-07-18 DIAGNOSIS — R51 Headache: Secondary | ICD-10-CM | POA: Diagnosis present

## 2017-07-19 ENCOUNTER — Ambulatory Visit: Payer: Commercial Managed Care - PPO | Attending: Family Medicine | Admitting: Physical Therapy

## 2017-07-20 ENCOUNTER — Ambulatory Visit
Admission: RE | Admit: 2017-07-20 | Discharge: 2017-07-20 | Disposition: A | Discharge status: Home / Self Care | Payer: Medicare Other | Source: Ambulatory Visit | Attending: Family Medicine | Admitting: Family Medicine

## 2017-07-20 DIAGNOSIS — R42 Dizziness and giddiness: Secondary | ICD-10-CM | POA: Insufficient documentation

## 2017-07-20 DIAGNOSIS — F99 Mental disorder, not otherwise specified: Secondary | ICD-10-CM | POA: Insufficient documentation

## 2017-07-20 DIAGNOSIS — R51 Headache: Secondary | ICD-10-CM | POA: Insufficient documentation

## 2017-07-20 DIAGNOSIS — R410 Disorientation, unspecified: Secondary | ICD-10-CM

## 2017-07-20 DIAGNOSIS — E041 Nontoxic single thyroid nodule: Secondary | ICD-10-CM | POA: Insufficient documentation

## 2017-07-20 DIAGNOSIS — I6523 Occlusion and stenosis of bilateral carotid arteries: Secondary | ICD-10-CM | POA: Insufficient documentation

## 2017-07-20 DIAGNOSIS — R519 Headache, unspecified: Secondary | ICD-10-CM

## 2017-07-26 ENCOUNTER — Ambulatory Visit: Payer: Commercial Managed Care - PPO | Admitting: Physical Therapy

## 2017-08-16 ENCOUNTER — Ambulatory Visit: Payer: Medicare Other | Attending: Family Medicine | Admitting: Physical Therapy

## 2017-08-16 ENCOUNTER — Encounter: Payer: Self-pay | Admitting: Physical Therapy

## 2017-08-16 ENCOUNTER — Other Ambulatory Visit: Payer: Self-pay

## 2017-08-16 DIAGNOSIS — Z9181 History of falling: Secondary | ICD-10-CM | POA: Insufficient documentation

## 2017-08-16 DIAGNOSIS — M6281 Muscle weakness (generalized): Secondary | ICD-10-CM | POA: Diagnosis present

## 2017-08-16 DIAGNOSIS — R262 Difficulty in walking, not elsewhere classified: Secondary | ICD-10-CM

## 2017-08-16 DIAGNOSIS — R42 Dizziness and giddiness: Secondary | ICD-10-CM | POA: Diagnosis present

## 2017-08-16 NOTE — Therapy (Signed)
[facility] MAIN [facility] SERVICES [address] [place], [place], [address] Phone: [phone]   Fax:  [phone]  Physical Therapy Re-evaluation  Patient Details  Name: Denise Cervantes MRN: [id number] Date of Birth: 10-21-1925 Referring Provider: Dr. Burnadette Pop   Encounter Date: 08/16/2017  PT End of Session - 08/16/17 1128    Visit Number  6    Number of Visits  17    Date for PT Re-Evaluation  10/11/17    PT Start Time  1110    PT Stop Time  1208    PT Time Calculation (min)  58 min    Equipment Utilized During Treatment  Gait belt    Activity Tolerance  Patient tolerated treatment well    Behavior During Therapy  [facility] for tasks assessed/performed       Past Medical History:  Diagnosis Date  . A-fib (HCC)   . Bronchitis, obstructive, chronic (HCC)   . COPD exacerbation (HCC)   . Depression   . Diabetes (HCC)    type II  . Esophageal reflux disease   . Fibromyalgia   . Glaucoma   . HBP (high blood pressure)   . Hypercholesterolemia   . Hypertension   . Memory loss     History reviewed. No pertinent surgical history.  There were no vitals filed for this visit.   Subjective Assessment - 08/16/17 1124    Subjective  Patient reports that she has had a stroke since last being seen in the clinic. Patient states she has had numerous life stressors and reports that she has 3 deaths in the family. Patient reports that the biggest issue she is currently having is her balance.     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 Patient last seen in the clinic on 07/05/2017. Patient was seen on 07/12/2017 by Debbra Riding, PA after patient had an episode where she became disoriented, not able to focus and had a period of aphasia for about an hour per MR. Patient had CT  head on 3/5 which indicated no evidence of acute abnormality and mild to moderate chronic small vessel disease per MR. Then on 07/15/2017, Debbra Riding saw patient for headache along occipital skull down into neck, continuous confusion disorientation per MR. Patient had a brain MRI on 07/18/2017 which revealed acute/subacute infarct affecting left parietal gyrus consistent with L MCA infarct per MR. Patient had a Carotid US doppler study on 07/20/2017 which revealed <50% stenosis R ICA and >70% stenosis L ICA. Patient seen by Dr. Burnadette Pop on 07/28/2017 for headache, confusion and AMS. Patient returns to clinic this date reporting that she had a stroke. Patient states she has been having all balance problems, but reports no dizziness issues currently. Patient reports that she is doing well getting into/out of bed and rolling over in bed. Patient states she walks a lot in her home multiple times a day. Patient lives in a cottage at [facility] with her husband and patient reports her husband has dementia and she helps to take care of him. Patient states the only difference is her balance has gotten worse since last being seen in the clinic, but she states she feels it is improving. During evaluation, patient does affirm that she has also noticed some weakness on her left side of the body. Patient states she has  some leg pains at times, stomach pains and bilateral forearm soreness. Patient states she feels she is allergic to the Crestor that she got started on again about 3 months ago. Patient reports that she is now using a [facility] for community mobility and uses rolling walker at night when she needs to get up to use the restroom and first thing in the morning until she feels steady enough. Patient started using the [facility] in February. Patient reports she has been having intermittent headaches primarily on the left side of her head for the last 3 months and reports the headaches have been gradually getting better. Patient  reports she has an appointment with Dr. Sherryll Burger for cervical blocks this afternoon. Patient states that she    Diagnostic tests  CT head; MRI brain; US carotid     Patient Stated Goals  Patient would like to improve her strength and balance.     Currently in Pain?  No/denies reports she took a Tylenol    Pain Onset  1 to 4 weeks ago         [facility] PT Assessment - 08/16/17 1153      Standardized Balance Assessment   Standardized Balance Assessment  Berg Balance Test;Dynamic Gait Index      Berg Balance Test   Sit to Stand  Able to stand without using hands and stabilize independently    Standing Unsupported  Able to stand 2 minutes with supervision    Sitting with Back Unsupported but Feet Supported on Floor or Stool  Able to sit safely and securely 2 minutes    Stand to Sit  Sits safely with minimal use of hands    Transfers  Able to transfer safely, minor use of hands    Standing Unsupported with Eyes Closed  Able to stand 10 seconds with supervision    Standing Ubsupported with Feet Together  Able to place feet together independently and stand for 1 minute with supervision    From Standing, Reach Forward with Outstretched Arm  Can reach confidently >25 cm (10")    From Standing Position, Pick up Object from Floor  Able to pick up shoe safely and easily    From Standing Position, Turn to Look Behind Over each Shoulder  Looks behind from both sides and weight shifts well    Turn 360 Degrees  Able to turn 360 degrees safely in 4 seconds or less    Standing Unsupported, Alternately Place Feet on Step/Stool  Able to stand independently and safely and complete 8 steps in 20 seconds    Standing Unsupported, One Foot in Front  Able to plae foot ahead of the other independently and hold 30 seconds    Standing on One Leg  Tries to lift leg/unable to hold 3 seconds but remains standing independently    Total Score  49      Dynamic Gait Index   Level Surface  Normal    Change in Gait Speed  Mild  Impairment    Gait with Horizontal Head Turns  Normal    Gait with Vertical Head Turns  Normal    Gait and Pivot Turn  Normal    Step Over Obstacle  Moderate Impairment    Step Around Obstacles  Normal    Steps  Mild Impairment    Total Score  20         RE-EVALUATION HISTORY:  Subjective history of current problem: Patient was being seen initially for vestibular evaluation on 05/31/2017  and was found to have Left posterior canal BPPV as well as indicators of potential central findings with vestibulo-ocular examination. Patient last seen in the clinic on 07/05/2017. Patient was seen on 07/12/2017 by Debbra Riding, PA after patient had an episode where she became disoriented, not able to focus and had a period of aphasia for about an hour per MR. Patient had CT head on 3/5 which indicated no evidence of acute abnormality and mild to moderate chronic small vessel disease per MR. Then on 07/15/2017, Debbra Riding saw patient for headache along occipital skull down into neck, continuous confusion disorientation per MR. Patient had a brain MRI on 07/18/2017 which revealed acute/subacute infarct affecting left parietal gyrus consistent with L MCA infarct per MR. Patient had a Carotid US doppler study on 07/20/2017 which revealed <50% stenosis R ICA and >70% stenosis L ICA. Patient seen by Dr. Burnadette Pop on 07/28/2017 for headache, confusion and AMS. Patient returns to clinic this date reporting that she had a stroke. Patient states she has been having all balance problems, but reports no dizziness issues currently. Patient reports that she is doing well getting into/out of bed and rolling over in bed. Patient states she walks a lot in her home multiple times a day. Patient lives in a cottage at [facility] with her husband and patient reports her husband has dementia and she helps to take care of him. Patient states the only difference is her balance has gotten worse since last being seen in the clinic, but she  states she feels it is improving. During evaluation, patient does affirm that she has also noticed some weakness on her left side of the body. Patient states she has some leg pains at times, stomach pains and bilateral forearm soreness. Patient states she feels she is allergic to the Crestor that she got started on again about 3 months ago. Patient reports that she is now using a [facility] for community mobility and uses rolling walker at night when she needs to get up to use the restroom and first thing in the morning until she feels steady enough. Patient started using the [facility] in February. Patient reports she has been having intermittent headaches primarily on the left side of her head for the last 3 months and reports the headaches have been gradually getting better. Patient reports she has an appointment with Dr. Sherryll Burger for cervical blocks this afternoon. Patient states that she has had 3 family members pass away in the past few months and reports she has many life stressors at present. Patient states she has many medical appointments and is concerned about trying to keep everything straight.   Falls (yes/no): yes Number of falls in past 6 months: 4  Current Symptoms: (dysarthria, dysphagia, drop attacks, bowel and bladder changes, recent weight loss/gain) Patient did have episode of aphasia on 07/12/2017 and was evaluated and ruled in for stroke per MR.     EXAMINATION  Note: Patient arrived for session and was flustered stating she could not remember if she had an appointment with Dr. Sherryll Burger this afternoon and was worried about having someone be able to drive her to and accompany her for the appointment. Patient distractable during session and required redirection and encouragement to attend to tasks. Will plan on assessing light touch and coordination next visit secondary to time constraints this date.   SOMATOSENSORY:         Sensation           Intact  Diminished         Absent  Light touch TBA     Any N & T in extremities or weakness: denies but reports discomfort in bilateral forearms      COORDINATION: Finger to Nose:  TBA Past Pointing:   TBA  MUSCULOSKELETAL SCREEN:   ROM:  Bilateral UEs elbow, wrist and fingers AROM WFL. Right shoulder flexion AROM to grossly 95 degrees And left shoulder flexion to greater than 90 degrees.  Bilateral LEs AROM WFL.  MMT:  Left shoulder flexors 4/5 deferred right secondary to chronic right shoulder injury  Bilateral triceps 3/5, biceps 4/5 rest 5/5 Left hip flexion 4/5, hamstring -5/5, quads +4/5, DF +3/5 Right hip flexion -5/5, hamstring -5/5, quads +4/5, DF -4/5  Functional Mobility: Patient independent with sit to stand transfers from chair and mat table. Patient able to demonstrate safe transfer sit to stand without use of UEs.   Gait: Patient arrives to clinic ambulating with SPC. Patient ambulated up/down 4 steps with rails with S.  Patient had difficulty stepping over object while walking. Patient does well sequencing with SPC.   Balance: Patient demonstrates difficulty with narrow BOS, uneven surfaces, tandem stance, SLS activities.   FUNCTIONAL OUTCOME MEASURES:  Results Comments  DHI 58/100 Moderate perception of handicap; in need of intervention  ABC Scale 46.2% High falls risk; in need of intervention  DGI 20/24 Falls risk; in need of intervention  Berg balance scale 49/56 Falls risk; in need of intervention     PT Education - 08/16/17 1127    Education provided  Yes    Education Details  discussed medical issues patient has been having since last being seen in the clinic; discussed plan of care and goals    Person(s) Educated  Patient    Methods  Explanation;Demonstration    Comprehension  Verbalized understanding       PT Short Term Goals - 08/16/17 1129      PT SHORT TERM GOAL #1   Title  Patient will be independent with home exercise program for self-management.    Baseline  --    Time  4    Period   Weeks    Status  New    Target Date  09/13/17      PT SHORT TERM GOAL #2   Title  --    Baseline  --    Time  --    Period  --    Status  --        PT Long Term Goals - 08/16/17 1128      PT LONG TERM GOAL #1   Title  Patient will demonstrate reduced falls risk as evidenced by Dynamic Gait Index (DGI) 22/24 or greater.    Baseline  scored 20/24 on 08/16/17    Time  8    Period  Weeks    Status  New    Target Date  10/11/17      PT LONG TERM GOAL #2   Title  Patient will reduce perceived disability to low levels as indicated by <40 on Dizziness Handicap Inventory to demonstrate significant improvement in dizziness.    Baseline  scored 58/100 on 08/16/17    Time  8    Period  Weeks    Status  New    Target Date  10/11/17      PT LONG TERM GOAL #3   Title  Patient will have demonstrate decreased falls risk as indicated by Activities Specific  Balance Confidence Scale score of 67% or greater.    Baseline  scored 46.2% on 08/16/17    Time  8    Period  Weeks    Status  New    Target Date  10/11/17      PT LONG TERM GOAL #4   Title  Patient will improve by 4 points or more on the Berg balance scale to demonstrate decreased falls risk and improved function at home.    Baseline  scored 49/56 on 08/16/2017    Time  8    Period  Weeks    Status  New    Target Date  10/11/17             Plan - 08/16/17 1129    Clinical Impression Statement  Patient with change in status as per medical record, patient suffered a stroke 07/2017. Patient returns to clinic for physical therapy. Re-evauation performed this date and patient noted to have strength and balance deficits. Patient has had numerous falls in the past year as well. Recommended that patient do PT two times a week, but patient reporting that she is only able to do once a week during the month of April. Patient would benefit from PT services to address functional deficits, goals and to reduce falls risk.     Rehab Potential  Fair     Clinical Impairments Affecting Rehab Potential  positive indicators: motivated  negative indicators: age, memory loss, s/p acute CVA per MR    PT Frequency  1x / week    PT Duration  8 weeks    PT Treatment/Interventions  Vestibular;Canalith Repostioning;Gait training;Stair training;Therapeutic activities;Therapeutic exercise;Balance training;Neuromuscular re-education;Patient/family education    PT Next Visit Plan  --    PT Home Exercise Plan  --    Consulted and Agree with Plan of Care  Patient       Patient will benefit from skilled therapeutic intervention in order to improve the following deficits and impairments:  Dizziness, Pain, Decreased strength, Decreased balance, Difficulty walking, Decreased coordination  Visit Diagnosis: Difficulty in walking, not elsewhere classified  Muscle weakness (generalized)  History of falling     Problem List There are no active problems to display for this patient.  Mardelle Matte PT, DPT [phone] Mardelle Matte 08/16/2017, 7:28 PM  [facility] MAIN [facility] SERVICES [address] [place], [place], [address] Phone: [phone]   Fax:  [phone]  Name: Denise Cervantes MRN: [id number] Date of Birth: [birth year]

## 2017-08-23 ENCOUNTER — Ambulatory Visit: Payer: Medicare Other | Admitting: Physical Therapy

## 2017-08-30 ENCOUNTER — Ambulatory Visit: Payer: Medicare Other | Admitting: Physical Therapy

## 2017-08-30 ENCOUNTER — Encounter: Payer: Self-pay | Admitting: Physical Therapy

## 2017-08-30 DIAGNOSIS — Z9181 History of falling: Secondary | ICD-10-CM

## 2017-08-30 DIAGNOSIS — R262 Difficulty in walking, not elsewhere classified: Secondary | ICD-10-CM

## 2017-08-30 DIAGNOSIS — R42 Dizziness and giddiness: Secondary | ICD-10-CM

## 2017-08-30 DIAGNOSIS — M6281 Muscle weakness (generalized): Secondary | ICD-10-CM

## 2017-08-30 NOTE — Therapy (Signed)
[facility] MAIN [facility] SERVICES [address] [place], [place], [address] Phone: [phone]   Fax:  [phone]  Physical Therapy Treatment  Patient Details  Name: Denise Cervantes MRN: [id number] Date of Birth: 13-Feb-1926 Referring Provider: Dr. Burnadette Pop   Encounter Date: 08/30/2017    Past Medical History:  Diagnosis Date  . A-fib (HCC)   . Bronchitis, obstructive, chronic (HCC)   . COPD exacerbation (HCC)   . Depression   . Diabetes (HCC)    type II  . Esophageal reflux disease   . Fibromyalgia   . Glaucoma   . HBP (high blood pressure)   . Hypercholesterolemia   . Hypertension   . Memory loss     History reviewed. No pertinent surgical history.  There were no vitals filed for this visit.  Subjective Assessment - 09/01/17 1139    Subjective  Patient states she has not been having any dizziness and states that most of her problems are with imbalance. Patient states she especially feels imbalanced when she tries to turn around.  Patient states a thyroid ultrasound and they found 2 nodules so she is getting a biopsy on Thursday. Patient states her shoulders are hurting and that she has rotator cuff damage in her right shoulder. Patient states she is thinking about getting a shot for her shoulder.     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 Patient last seen in the clinic on 07/05/2017. Patient was seen on 07/12/2017 by Debbra Riding, PA after patient had an episode where she became disoriented, not able to focus and had a period of aphasia for about an hour per MR. Patient had CT head on 3/5 which indicated no evidence of acute abnormality and mild to moderate chronic small vessel disease per MR. Then on 07/15/2017, Debbra Riding saw patient for headache along  occipital skull down into neck, continuous confusion disorientation per MR. Patient had a brain MRI on 07/18/2017 which revealed acute/subacute infarct affecting left parietal gyrus consistent with L MCA infarct per MR. Patient had a Carotid US doppler study on 07/20/2017 which revealed <50% stenosis R ICA and >70% stenosis L ICA. Patient seen by Dr. Burnadette Pop on 07/28/2017 for headache, confusion and AMS. Patient returns to clinic this date reporting that she had a stroke. Patient states she has been having all balance problems, but reports no dizziness issues currently. Patient reports that she is doing well getting into/out of bed and rolling over in bed. Patient states she walks a lot in her home multiple times a day. Patient lives in a cottage at [facility] with her husband and patient reports her husband has dementia and she helps to take care of him. Patient states the only difference is her balance has gotten worse since last being seen in the clinic, but she states she feels it is improving. During evaluation, patient does affirm that she has also noticed some weakness on her left side of the body. Patient states she has some leg pains at times, stomach pains and bilateral forearm soreness. Patient states she feels she is allergic to the Crestor that she got started on again about 3 months ago. Patient reports that she is now using a [facility] for community mobility and uses rolling walker at night when she needs to get up to use the restroom and  first thing in the morning until she feels steady enough. [facility]. Patient reports she has been having intermittent headaches primarily on the left side of her head for the last 3 months and reports the headaches have been gradually getting better. Patient reports she has an appointment with Dr. Sherryll BurgerShah for cervical blocks this afternoon. Patient states that she    Diagnostic tests  CT head; MRI brain; US carotid     Patient Stated Goals   Patient would like to improve her strength and balance.     Currently in Pain?  No/denies    Pain Onset  1 to 4 weeks ago       Neuromuscular Re-education:  Step Ups: Patient performed step ups and back on 6" wooden box 10 reps without upper extremity support with CGA. Performed side stepping up onto box, over and back 10 reps.   Patient required min A to CGA and reached for support to // bar several times to regain balance. Patient noted to Trendelenberg on right hip when stepping toward right side.  Slow Marching: Patient performed on purple Airex pad, slow marching with 3 second holds 10 reps each leg with CGA.  Patient reported fatigue in legs with this exercise and had a sitting rest break.   Alternating foot taps:  Patient performed alternating foot taps standing on purple foam pad to 6" wooden step and then repeated with standing on purple foam but step tapping to 6" wooden step with green Dynadisc placed on top 20 reps. Patient with CGA secondary to patient had several small losses of balance and at times reached for // bar for support.    Therapeutic Exercise:  Nustep: Patient performed Nu-step machine on level 3 for 5 minutes with cuing for pace.   Leg Press: Patient performed leg press machine #110 pounds 3 sets of 10 reps with verbal cue to not lock knees in extension.  Patient reports fatigue in legs with this activity.  Patient able to perform demonstrating good technique after cuing.  Hip abduction: Patient performed 20 reps hip abduction in standing with 2# ankle weight with vc for form.   Mini-squats: Patient performed 15 reps mini-squats with 5 second holds with verbal cues for technique.    PT Education - 09/01/17 1145    Education provided  Yes    Education Details  Issued mini-squats and slow marching for HEP. Discussed safety precautions with performing HEP at home. Demonstrated and discussed standing in corner with chair in front for safety and then  patient demonstrated.    Person(s) Educated  Patient    Methods  Explanation;Demonstration;Handout;Verbal cues    Comprehension  Returned demonstration;Verbalized understanding       PT Short Term Goals - 08/16/17 1129      PT SHORT TERM GOAL #1   Title  Patient will be independent with home exercise program for self-management.    Baseline  --    Time  4    Period  Weeks    Status  New    Target Date  09/13/17      PT SHORT TERM GOAL #2   Title  --    Baseline  --    Time  --    Period  --    Status  --        PT Long Term Goals - 08/16/17 1128      PT LONG TERM GOAL #1   Title  Patient will  demonstrate reduced falls risk as evidenced by Dynamic Gait Index (DGI) 22/24 or greater.    Baseline  scored 20/24 on 08/16/17    Time  8    Period  Weeks    Status  New    Target Date  10/11/17      PT LONG TERM GOAL #2   Title  Patient will reduce perceived disability to low levels as indicated by <40 on Dizziness Handicap Inventory to demonstrate significant improvement in dizziness.    Baseline  scored 58/100 on 08/16/17    Time  8    Period  Weeks    Status  New    Target Date  10/11/17      PT LONG TERM GOAL #3   Title  Patient will have demonstrate decreased falls risk as indicated by Activities Specific Balance Confidence Scale score of 67% or greater.    Baseline  scored 46.2% on 08/16/17    Time  8    Period  Weeks    Status  New    Target Date  10/11/17      PT LONG TERM GOAL #4   Title  Patient will improve by 4 points or more on the Berg balance scale to demonstrate decreased falls risk and improved function at home.    Baseline  scored 49/56 on 08/16/2017    Time  8    Period  Weeks    Status  New    Target Date  10/11/17            Plan - 09/01/17 1140    Clinical Impression Statement  Patient demonstrated hip abduction weakness during step ups this date. Will plan on continuing to work on LEs muscle strengthening in subsequent treatment sessions as  well as continued balance progressions. Patient has difficulty with uneven surfaces, narrow base of support and single leg stance activities. Patient reports at home she feels the most unsteady when she gets up from a chair and turns or when she is walking and turns. Therefore, will work on turning next visist as well. Patient would benefit from continued PT services to further address deficits and to reduce falls risk.     Rehab Potential  Fair    Clinical Impairments Affecting Rehab Potential  positive indicators: motivated  negative indicators: age, memory loss, s/p acute CVA per MR    PT Frequency  1x / week    PT Duration  8 weeks    PT Treatment/Interventions  Vestibular;Canalith Repostioning;Gait training;Stair training;Therapeutic activities;Therapeutic exercise;Balance training;Neuromuscular re-education;Patient/family education    PT Next Visit Plan  4 way hip, ambulation with turning and getting up from chair with turning    PT Home Exercise Plan  slow marching and mini squats    Consulted and Agree with Plan of Care  Patient       Patient will benefit from skilled therapeutic intervention in order to improve the following deficits and impairments:  Dizziness, Pain, Decreased strength, Decreased balance, Difficulty walking, Decreased coordination  Visit Diagnosis: Difficulty in walking, not elsewhere classified  Muscle weakness (generalized)  History of falling  Dizziness and giddiness     Problem List There are no active problems to display for this patient.  Mardelle Matte PT, DPT [phone] Mardelle Matte 09/01/2017, 11:47 AM  [facility] MAIN [facility] SERVICES [address] [place], [place], [address] Phone: [phone]   Fax:  [phone]  Name: Denise Cervantes MRN: [id number] Date of Birth: 24-May-1925

## 2017-09-02 ENCOUNTER — Encounter: Payer: Self-pay | Admitting: Emergency Medicine

## 2017-09-02 ENCOUNTER — Emergency Department: Payer: Medicare Other

## 2017-09-02 ENCOUNTER — Other Ambulatory Visit: Payer: Self-pay

## 2017-09-02 ENCOUNTER — Inpatient Hospital Stay
Admission: EM | Admit: 2017-09-02 | Discharge: 2017-09-05 | DRG: 871 | Disposition: A | Discharge status: Skilled Nursing Facility | Payer: Medicare Other | Attending: Internal Medicine | Admitting: Internal Medicine

## 2017-09-02 DIAGNOSIS — Z7901 Long term (current) use of anticoagulants: Secondary | ICD-10-CM | POA: Diagnosis not present

## 2017-09-02 DIAGNOSIS — E876 Hypokalemia: Secondary | ICD-10-CM | POA: Diagnosis present

## 2017-09-02 DIAGNOSIS — K219 Gastro-esophageal reflux disease without esophagitis: Secondary | ICD-10-CM | POA: Diagnosis present

## 2017-09-02 DIAGNOSIS — Z880 Allergy status to penicillin: Secondary | ICD-10-CM

## 2017-09-02 DIAGNOSIS — E1165 Type 2 diabetes mellitus with hyperglycemia: Secondary | ICD-10-CM | POA: Diagnosis present

## 2017-09-02 DIAGNOSIS — H409 Unspecified glaucoma: Secondary | ICD-10-CM | POA: Diagnosis present

## 2017-09-02 DIAGNOSIS — A419 Sepsis, unspecified organism: Principal | ICD-10-CM | POA: Diagnosis present

## 2017-09-02 DIAGNOSIS — I4891 Unspecified atrial fibrillation: Secondary | ICD-10-CM

## 2017-09-02 DIAGNOSIS — M797 Fibromyalgia: Secondary | ICD-10-CM | POA: Diagnosis present

## 2017-09-02 DIAGNOSIS — J440 Chronic obstructive pulmonary disease with acute lower respiratory infection: Secondary | ICD-10-CM | POA: Diagnosis present

## 2017-09-02 DIAGNOSIS — E871 Hypo-osmolality and hyponatremia: Secondary | ICD-10-CM | POA: Diagnosis present

## 2017-09-02 DIAGNOSIS — Z87891 Personal history of nicotine dependence: Secondary | ICD-10-CM

## 2017-09-02 DIAGNOSIS — Z7984 Long term (current) use of oral hypoglycemic drugs: Secondary | ICD-10-CM | POA: Diagnosis not present

## 2017-09-02 DIAGNOSIS — T380X5A Adverse effect of glucocorticoids and synthetic analogues, initial encounter: Secondary | ICD-10-CM | POA: Diagnosis present

## 2017-09-02 DIAGNOSIS — Z79899 Other long term (current) drug therapy: Secondary | ICD-10-CM

## 2017-09-02 DIAGNOSIS — J441 Chronic obstructive pulmonary disease with (acute) exacerbation: Secondary | ICD-10-CM | POA: Diagnosis present

## 2017-09-02 DIAGNOSIS — I10 Essential (primary) hypertension: Secondary | ICD-10-CM | POA: Diagnosis present

## 2017-09-02 DIAGNOSIS — Z66 Do not resuscitate: Secondary | ICD-10-CM | POA: Diagnosis present

## 2017-09-02 DIAGNOSIS — J189 Pneumonia, unspecified organism: Secondary | ICD-10-CM | POA: Diagnosis present

## 2017-09-02 DIAGNOSIS — E785 Hyperlipidemia, unspecified: Secondary | ICD-10-CM | POA: Diagnosis present

## 2017-09-02 DIAGNOSIS — I482 Chronic atrial fibrillation: Secondary | ICD-10-CM | POA: Diagnosis present

## 2017-09-02 DIAGNOSIS — J9601 Acute respiratory failure with hypoxia: Secondary | ICD-10-CM | POA: Diagnosis present

## 2017-09-02 DIAGNOSIS — G9340 Encephalopathy, unspecified: Secondary | ICD-10-CM

## 2017-09-02 LAB — CBC WITH DIFFERENTIAL/PLATELET
BASOS ABS: 0.1 10*3/uL (ref 0–0.1)
BASOS PCT: 1 %
Eosinophils Absolute: 0 10*3/uL (ref 0–0.7)
Eosinophils Relative: 0 %
HEMATOCRIT: 34.5 % — AB (ref 35.0–47.0)
HEMOGLOBIN: 11.6 g/dL — AB (ref 12.0–16.0)
Lymphocytes Relative: 9 %
Lymphs Abs: 1.4 10*3/uL (ref 1.0–3.6)
MCH: 31 pg (ref 26.0–34.0)
MCHC: 33.6 g/dL (ref 32.0–36.0)
MCV: 92.3 fL (ref 80.0–100.0)
Monocytes Absolute: 1.8 10*3/uL — ABNORMAL HIGH (ref 0.2–0.9)
Monocytes Relative: 12 %
NEUTROS ABS: 12.1 10*3/uL — AB (ref 1.4–6.5)
NEUTROS PCT: 78 %
Platelets: 185 10*3/uL (ref 150–440)
RBC: 3.74 MIL/uL — AB (ref 3.80–5.20)
RDW: 14.1 % (ref 11.5–14.5)
WBC: 15.4 10*3/uL — ABNORMAL HIGH (ref 3.6–11.0)

## 2017-09-02 LAB — COMPREHENSIVE METABOLIC PANEL
ALBUMIN: 3 g/dL — AB (ref 3.5–5.0)
ALT: 13 U/L — ABNORMAL LOW (ref 14–54)
ANION GAP: 10 (ref 5–15)
AST: 16 U/L (ref 15–41)
Alkaline Phosphatase: 47 U/L (ref 38–126)
BILIRUBIN TOTAL: 1 mg/dL (ref 0.3–1.2)
BUN: 13 mg/dL (ref 6–20)
CO2: 19 mmol/L — ABNORMAL LOW (ref 22–32)
Calcium: 7.6 mg/dL — ABNORMAL LOW (ref 8.9–10.3)
Chloride: 102 mmol/L (ref 101–111)
Creatinine, Ser: 0.67 mg/dL (ref 0.44–1.00)
GFR calc Af Amer: >60 mL/min (ref >60)
Glucose, Bld: 157 mg/dL — ABNORMAL HIGH (ref 65–99)
POTASSIUM: 3.2 mmol/L — AB (ref 3.5–5.1)
Sodium: 131 mmol/L — ABNORMAL LOW (ref 135–145)
TOTAL PROTEIN: 6.2 g/dL — AB (ref 6.5–8.1)

## 2017-09-02 LAB — URINALYSIS, COMPLETE (UACMP) WITH MICROSCOPIC
Bilirubin Urine: NEGATIVE
Glucose, UA: NEGATIVE mg/dL
HGB URINE DIPSTICK: NEGATIVE
Ketones, ur: NEGATIVE mg/dL
Leukocytes, UA: NEGATIVE
NITRITE: NEGATIVE
PROTEIN: 100 mg/dL — AB
SPECIFIC GRAVITY, URINE: 1.014 (ref 1.005–1.030)
pH: 7 (ref 5.0–8.0)

## 2017-09-02 LAB — PROCALCITONIN: Procalcitonin: 1.75 ng/mL

## 2017-09-02 LAB — LACTIC ACID, PLASMA: LACTIC ACID, VENOUS: 1.1 mmol/L (ref 0.5–1.9)

## 2017-09-02 LAB — MRSA PCR SCREENING: MRSA by PCR: NEGATIVE

## 2017-09-02 LAB — TROPONIN I

## 2017-09-02 MED ORDER — METFORMIN HCL 500 MG PO TABS
500.0000 mg | ORAL_TABLET | Freq: Two times a day (BID) | ORAL | Status: DC
  Administered 2017-09-02 – 2017-09-05 (×6): 500 mg via ORAL
  Filled 2017-09-02 (×6): qty 1

## 2017-09-02 MED ORDER — SODIUM CHLORIDE 0.9 % IV BOLUS (SEPSIS)
1000.0000 mL | Freq: Once | INTRAVENOUS | Status: AC
  Administered 2017-09-02: 1000 mL via INTRAVENOUS

## 2017-09-02 MED ORDER — IPRATROPIUM-ALBUTEROL 0.5-2.5 (3) MG/3ML IN SOLN
3.0000 mL | Freq: Once | RESPIRATORY_TRACT | Status: AC
  Administered 2017-09-02: 3 mL via RESPIRATORY_TRACT
  Filled 2017-09-02: qty 3

## 2017-09-02 MED ORDER — SENNOSIDES-DOCUSATE SODIUM 8.6-50 MG PO TABS: 1.0000 | ORAL_TABLET | Freq: Every evening | ORAL | Status: DC | PRN

## 2017-09-02 MED ORDER — VANCOMYCIN HCL IN DEXTROSE 750-5 MG/150ML-% IV SOLN
750.0000 mg | INTRAVENOUS | Status: DC
  Administered 2017-09-03: 750 mg via INTRAVENOUS
  Filled 2017-09-02 (×2): qty 150

## 2017-09-02 MED ORDER — LATANOPROST 0.005 % OP SOLN
1.0000 [drp] | Freq: Every day | OPHTHALMIC | Status: DC
  Administered 2017-09-02 – 2017-09-04 (×3): 1 [drp] via OPHTHALMIC
  Filled 2017-09-02: qty 2.5

## 2017-09-02 MED ORDER — SODIUM CHLORIDE 0.9 % IV SOLN
2.0000 g | Freq: Two times a day (BID) | INTRAVENOUS | Status: DC
  Administered 2017-09-02: 2 g via INTRAVENOUS
  Filled 2017-09-02 (×3): qty 2

## 2017-09-02 MED ORDER — ACETAMINOPHEN 500 MG PO TABS
1000.0000 mg | ORAL_TABLET | Freq: Once | ORAL | Status: AC
  Administered 2017-09-02: 1000 mg via ORAL
  Filled 2017-09-02: qty 2

## 2017-09-02 MED ORDER — LOSARTAN POTASSIUM 50 MG PO TABS
100.0000 mg | ORAL_TABLET | Freq: Every day | ORAL | Status: DC
  Administered 2017-09-03 – 2017-09-05 (×3): 100 mg via ORAL
  Filled 2017-09-02 (×3): qty 2

## 2017-09-02 MED ORDER — POTASSIUM CHLORIDE CRYS ER 20 MEQ PO TBCR
40.0000 meq | EXTENDED_RELEASE_TABLET | Freq: Once | ORAL | Status: AC
  Administered 2017-09-02: 40 meq via ORAL
  Filled 2017-09-02: qty 2

## 2017-09-02 MED ORDER — ENOXAPARIN SODIUM 40 MG/0.4ML ~~LOC~~ SOLN: 40.0000 mg | SUBCUTANEOUS | Status: DC

## 2017-09-02 MED ORDER — SODIUM CHLORIDE 0.9 % IV SOLN
2.0000 g | Freq: Once | INTRAVENOUS | Status: AC
  Administered 2017-09-02: 2 g via INTRAVENOUS
  Filled 2017-09-02: qty 2

## 2017-09-02 MED ORDER — CALCIUM CARBONATE ANTACID 500 MG PO CHEW: 500.0000 mg | CHEWABLE_TABLET | Freq: Every day | ORAL | Status: DC | PRN

## 2017-09-02 MED ORDER — BIOTIN 5 MG PO CAPS: 1.0000 | ORAL_CAPSULE | Freq: Every day | ORAL | Status: DC

## 2017-09-02 MED ORDER — APIXABAN 5 MG PO TABS
5.0000 mg | ORAL_TABLET | Freq: Two times a day (BID) | ORAL | Status: DC
  Administered 2017-09-02 – 2017-09-05 (×6): 5 mg via ORAL
  Filled 2017-09-02 (×6): qty 1

## 2017-09-02 MED ORDER — BRIMONIDINE TARTRATE 0.2 % OP SOLN
1.0000 [drp] | Freq: Two times a day (BID) | OPHTHALMIC | Status: DC
  Administered 2017-09-02 – 2017-09-05 (×6): 1 [drp] via OPHTHALMIC
  Filled 2017-09-02: qty 5

## 2017-09-02 MED ORDER — ONDANSETRON HCL 4 MG/2ML IJ SOLN: 4.0000 mg | Freq: Four times a day (QID) | INTRAMUSCULAR | Status: DC | PRN

## 2017-09-02 MED ORDER — IPRATROPIUM-ALBUTEROL 0.5-2.5 (3) MG/3ML IN SOLN
3.0000 mL | Freq: Four times a day (QID) | RESPIRATORY_TRACT | Status: DC
  Administered 2017-09-02 (×2): 3 mL via RESPIRATORY_TRACT
  Filled 2017-09-02 (×2): qty 3

## 2017-09-02 MED ORDER — ESCITALOPRAM OXALATE 10 MG PO TABS
5.0000 mg | ORAL_TABLET | Freq: Every day | ORAL | Status: DC
  Administered 2017-09-03 – 2017-09-05 (×3): 5 mg via ORAL
  Filled 2017-09-02 (×3): qty 0.5
  Filled 2017-09-02: qty 1

## 2017-09-02 MED ORDER — AMIODARONE HCL 200 MG PO TABS
400.0000 mg | ORAL_TABLET | Freq: Once | ORAL | Status: AC
  Administered 2017-09-02: 400 mg via ORAL
  Filled 2017-09-02: qty 2

## 2017-09-02 MED ORDER — VITAMIN D 1000 UNITS PO TABS
1000.0000 [IU] | ORAL_TABLET | Freq: Every day | ORAL | Status: DC
  Administered 2017-09-03 – 2017-09-05 (×3): 1000 [IU] via ORAL
  Filled 2017-09-02 (×3): qty 1

## 2017-09-02 MED ORDER — SODIUM CHLORIDE 0.9% FLUSH: 3.0000 mL | INTRAVENOUS | Status: DC | PRN

## 2017-09-02 MED ORDER — BRIMONIDINE TARTRATE-TIMOLOL 0.2-0.5 % OP SOLN: 1.0000 [drp] | Freq: Two times a day (BID) | OPHTHALMIC | Status: DC

## 2017-09-02 MED ORDER — ROSUVASTATIN CALCIUM 10 MG PO TABS
5.0000 mg | ORAL_TABLET | Freq: Every day | ORAL | Status: DC
  Administered 2017-09-03 – 2017-09-05 (×3): 5 mg via ORAL
  Filled 2017-09-02 (×3): qty 1

## 2017-09-02 MED ORDER — CIPROFLOXACIN-DEXAMETHASONE 0.3-0.1 % OT SUSP
4.0000 [drp] | Freq: Two times a day (BID) | OTIC | Status: DC
  Administered 2017-09-02 – 2017-09-05 (×6): 4 [drp] via OTIC
  Filled 2017-09-02: qty 7.5

## 2017-09-02 MED ORDER — AMLODIPINE BESYLATE 5 MG PO TABS
5.0000 mg | ORAL_TABLET | Freq: Every day | ORAL | Status: DC
  Administered 2017-09-03 – 2017-09-05 (×3): 5 mg via ORAL
  Filled 2017-09-02 (×3): qty 1

## 2017-09-02 MED ORDER — ACETAMINOPHEN 650 MG RE SUPP: 650.0000 mg | Freq: Four times a day (QID) | RECTAL | Status: DC | PRN

## 2017-09-02 MED ORDER — TRAZODONE HCL 50 MG PO TABS
50.0000 mg | ORAL_TABLET | Freq: Every evening | ORAL | Status: DC | PRN
  Administered 2017-09-04 (×2): 50 mg via ORAL
  Filled 2017-09-02 (×2): qty 1

## 2017-09-02 MED ORDER — SODIUM CHLORIDE 0.9% FLUSH
3.0000 mL | Freq: Two times a day (BID) | INTRAVENOUS | Status: DC
  Administered 2017-09-02 – 2017-09-05 (×7): 3 mL via INTRAVENOUS

## 2017-09-02 MED ORDER — SODIUM CHLORIDE 0.9 % IV SOLN: 250.0000 mL | INTRAVENOUS | Status: DC | PRN

## 2017-09-02 MED ORDER — ACETAMINOPHEN 325 MG PO TABS: 650.0000 mg | ORAL_TABLET | Freq: Four times a day (QID) | ORAL | Status: DC | PRN

## 2017-09-02 MED ORDER — TIMOLOL MALEATE 0.5 % OP SOLN
1.0000 [drp] | Freq: Two times a day (BID) | OPHTHALMIC | Status: DC
  Administered 2017-09-02 – 2017-09-05 (×6): 1 [drp] via OPHTHALMIC
  Filled 2017-09-02: qty 5

## 2017-09-02 MED ORDER — ONDANSETRON HCL 4 MG PO TABS: 4.0000 mg | ORAL_TABLET | Freq: Four times a day (QID) | ORAL | Status: DC | PRN

## 2017-09-02 MED ORDER — VANCOMYCIN HCL IN DEXTROSE 1-5 GM/200ML-% IV SOLN
1000.0000 mg | Freq: Once | INTRAVENOUS | Status: AC
  Administered 2017-09-02: 1000 mg via INTRAVENOUS
  Filled 2017-09-02: qty 200

## 2017-09-02 MED ORDER — SODIUM CHLORIDE 0.9 % IV SOLN
INTRAVENOUS | Status: DC
  Administered 2017-09-02 – 2017-09-03 (×2): via INTRAVENOUS

## 2017-09-02 NOTE — Progress Notes (Signed)
Advanced care plan.  Purpose of the Encounter: CODE STATUS Parties in Attendance: Patient Patient's Decision Capacity: Good Subjective/Patient's story: Presented to the emergency room for weakness, cough Objective/Medical story Has pneumonia Goals of care determination:  Advanced directives discussed with patient Does not want any cardiac resuscitation, intubation and ventilatory of the need arises Goals met CODE STATUS: DNR Time spent discussing advanced care planning: 16 minutes

## 2017-09-02 NOTE — ED Notes (Signed)
Patient transported to X-ray 

## 2017-09-02 NOTE — ED Provider Notes (Signed)
[facility] Emergency Department Provider Note    First MD Initiated Contact with Patient 09/02/17 1059     (approximate)  I have reviewed the triage vital signs and the nursing notes.   HISTORY  Chief Complaint Weakness and Cough  Level V Caveat:  Acute encephalopathy  HPI Denise Cervantes is a 84 y.o. female presents to the ER for cough confusion and fever.  Patient reports that she was just recently put on antibiotics but cannot remember why or what type of antibiotics they are.  EMS reports azithromycin and Cipro.  Patient denies any pain but is confused and cannot provide much additional history.  Past Medical History:  Diagnosis Date  . A-fib (HCC)   . Bronchitis, obstructive, chronic (HCC)   . COPD exacerbation (HCC)   . Depression   . Diabetes (HCC)    type II  . Esophageal reflux disease   . Fibromyalgia   . Glaucoma   . HBP (high blood pressure)   . Hypercholesterolemia   . Hypertension   . Memory loss    No family history on file. History reviewed. No pertinent surgical history. There are no active problems to display for this patient.     Prior to Admission medications   Medication Sig Start Date End Date Taking? Authorizing Provider  acetaminophen (TYLENOL) 500 MG tablet Take 1,000 mg by mouth every 8 (eight) hours as needed for mild pain.    Yes [provider]  amLODipine (NORVASC) 5 MG tablet Take 5 mg by mouth daily.   Yes [provider]  apixaban (ELIQUIS) 5 MG TABS tablet Take 5 mg by mouth every 12 (twelve) hours.    Yes [provider]  azithromycin (ZITHROMAX) 250 MG tablet Take 2 tablets by mouth day 1 then take 1 tablet by mouth daily next 4 days 09/01/17 09/06/17 Yes [provider]  Biotin 5 MG CAPS Take 1 capsule by mouth daily.   Yes [provider]  brimonidine-timolol (COMBIGAN) 0.2-0.5 % ophthalmic solution Place 1 drop into the right eye 2 (two) times daily.   Yes  [provider]  Calcium Carbonate Antacid 400 MG CHEW Chew 400 mg by mouth daily as needed (stomach acid).    Yes [provider]  Cholecalciferol 1000 units capsule Take 1,000 Units by mouth daily.    Yes [provider]  CIPRODEX OTIC suspension Place 4 drops into the right ear 2 (two) times daily. 09/01/17 09/08/17 Yes [provider]  escitalopram (LEXAPRO) 5 MG tablet Take 1 tablet by mouth daily. 08/26/17  Yes [provider]  latanoprost (XALATAN) 0.005 % ophthalmic solution Place 1 drop into both eyes at bedtime.   Yes [provider]  losartan (COZAAR) 100 MG tablet Take 100 mg by mouth daily.   Yes [provider]  metFORMIN (GLUCOPHAGE) 500 MG tablet Take 500 mg by mouth 2 (two) times daily with a meal.   Yes [provider]  rosuvastatin (CRESTOR) 5 MG tablet Take 5 mg by mouth daily. 08/25/17  Yes [provider]  traZODone (DESYREL) 50 MG tablet Take 50 mg by mouth at bedtime as needed for sleep.    Yes [provider]  oxyCODONE-acetaminophen (PERCOCET) 5-325 MG tablet Take 1-2 tablets by mouth every 6 (six) hours as needed. Patient not taking: Reported on 05/31/2017 09/23/16   Emily Filbert, MD    Allergies Penicillins    Social History Social History   Tobacco Use  .  Smoking status: Former Smoker    Types: Cigarettes  . Smokeless tobacco: Never Used  Substance Use Topics  . Alcohol use: No  . Drug use: No    Review of Systems Patient denies headaches, rhinorrhea, blurry vision, numbness, shortness of breath, chest pain, edema, cough, abdominal pain, nausea, vomiting, diarrhea, dysuria, fevers, rashes or hallucinations unless otherwise stated above in HPI. ____________________________________________   PHYSICAL EXAM:  VITAL SIGNS: Vitals:   09/02/17 1213 09/02/17 1241  BP: 113/79 129/77  Pulse:  (!) 111  Resp: (!) 26 15  Temp:    SpO2:  100%    Constitutional:  Alert and disoriented.  in no acute distress. Eyes: Conjunctivae are normal.  Head: Atraumatic. Nose: No congestion/rhinnorhea. Mouth/Throat: Mucous membranes are moist.   Neck: No stridor. Painless ROM.  Cardiovascular: tachycardic irregularly irregular rhythm. Grossly normal heart sounds.  Good peripheral circulation. Respiratory: Normal respiratory effort.  No retractions. Lungs with coarse bibasilar breathsounds Gastrointestinal: Soft and nontender. No distention. No abdominal bruits. No CVA tenderness. Genitourinary:  Musculoskeletal: No lower extremity tenderness nor edema.  No joint effusions. Neurologic:  Normal speech and language. No gross focal neurologic deficits are appreciated. No facial droop Skin:  Skin is warm, dry and intact. No rash noted. Psychiatric: Speech and behavior are normal.  ____________________________________________   LABS (all labs ordered are listed, but only abnormal results are displayed)  Results for orders placed or performed during the hospital encounter of 09/02/17 (from the past 24 hour(s))  Lactic acid, plasma     Status: None   Collection Time: 09/02/17 11:22 AM  Result Value Ref Range   Lactic Acid, Venous 1.1 0.5 - 1.9 mmol/L  Comprehensive metabolic panel     Status: Abnormal   Collection Time: 09/02/17 11:22 AM  Result Value Ref Range   Sodium 131 (L) 135 - 145 mmol/L   Potassium 3.2 (L) 3.5 - 5.1 mmol/L   Chloride 102 101 - 111 mmol/L   CO2 19 (L) 22 - 32 mmol/L   Glucose, Bld 157 (H) 65 - 99 mg/dL   BUN 13 6 - 20 mg/dL   Creatinine, Ser 2.13 0.44 - 1.00 mg/dL   Calcium 7.6 (L) 8.9 - 10.3 mg/dL   Total Protein 6.2 (L) 6.5 - 8.1 g/dL   Albumin 3.0 (L) 3.5 - 5.0 g/dL   AST 16 15 - 41 U/L   ALT 13 (L) 14 - 54 U/L   Alkaline Phosphatase 47 38 - 126 U/L   Total Bilirubin 1.0 0.3 - 1.2 mg/dL   GFR calc non Af Amer >60 >60 mL/min   GFR calc Af Amer >60 >60 mL/min   Anion gap 10 5 - 15  Troponin I     Status: None   Collection  Time: 09/02/17 11:22 AM  Result Value Ref Range   Troponin I <0.03 <0.03 ng/mL  CBC WITH DIFFERENTIAL     Status: Abnormal   Collection Time: 09/02/17 11:22 AM  Result Value Ref Range   WBC 15.4 (H) 3.6 - 11.0 K/uL   RBC 3.74 (L) 3.80 - 5.20 MIL/uL   Hemoglobin 11.6 (L) 12.0 - 16.0 g/dL   HCT 08.6 (L) 57.8 - 46.9 %   MCV 92.3 80.0 - 100.0 fL   MCH 31.0 26.0 - 34.0 pg   MCHC 33.6 32.0 - 36.0 g/dL   RDW 62.9 52.8 - 41.3 %   Platelets 185 150 - 440 K/uL   Neutrophils Relative % 78 %   Neutro Abs 12.1 (  H) 1.4 - 6.5 K/uL   Lymphocytes Relative 9 %   Lymphs Abs 1.4 1.0 - 3.6 K/uL   Monocytes Relative 12 %   Monocytes Absolute 1.8 (H) 0.2 - 0.9 K/uL   Eosinophils Relative 0 %   Eosinophils Absolute 0.0 0 - 0.7 K/uL   Basophils Relative 1 %   Basophils Absolute 0.1 0 - 0.1 K/uL  Urinalysis, Complete w Microscopic     Status: Abnormal   Collection Time: 09/02/17 11:22 AM  Result Value Ref Range   Color, Urine YELLOW (A) YELLOW   APPearance CLEAR (A) CLEAR   Specific Gravity, Urine 1.014 1.005 - 1.030   pH 7.0 5.0 - 8.0   Glucose, UA NEGATIVE NEGATIVE mg/dL   Hgb urine dipstick NEGATIVE NEGATIVE   Bilirubin Urine NEGATIVE NEGATIVE   Ketones, ur NEGATIVE NEGATIVE mg/dL   Protein, ur 161100 (A) NEGATIVE mg/dL   Nitrite NEGATIVE NEGATIVE   Leukocytes, UA NEGATIVE NEGATIVE   Squamous Epithelial / LPF 0-5 0 - 5   WBC, UA 0-5 0 - 5 WBC/hpf   RBC / HPF 0-5 0 - 5 RBC/hpf   Bacteria, UA RARE (A) NONE SEEN   ____________________________________________  EKG My review and personal interpretation at Time: 10:54   Indication: cough  Rate: 150  Rhythm: afib with rvr Axis: normal Other: normal intervals no stemi, nonspecific st changes likely rate dependent ____________________________________________  RADIOLOGY  I personally reviewed all radiographic images ordered to evaluate for the above acute complaints and reviewed radiology reports and findings.  These findings were personally  discussed with the patient.  Please see medical record for radiology report.  ____________________________________________   PROCEDURES  Procedure(s) performed:  .Critical Care Performed by: Willy Eddyobinson, Kayson Bullis, MD Authorized by: Willy Eddyobinson, Evelen Vazguez, MD   Critical care provider statement:    Critical care time (minutes):  30   Critical care time was exclusive of:  Separately billable procedures and treating other patients   Critical care was necessary to treat or prevent imminent or life-threatening deterioration of the following conditions:  Sepsis   Critical care was time spent personally by me on the following activities:  Development of treatment plan with patient or surrogate, discussions with consultants, evaluation of patient's response to treatment, examination of patient, obtaining history from patient or surrogate, ordering and performing treatments and interventions, ordering and review of laboratory studies, ordering and review of radiographic studies, pulse oximetry, re-evaluation of patient's condition and review of old charts      Critical Care performed: yes ____________________________________________   INITIAL IMPRESSION / ASSESSMENT AND PLAN / ED COURSE  Pertinent labs & imaging results that were available during my care of the patient were reviewed by me and considered in my medical decision making (see chart for details).  DDX: Dehydration, sepsis, pna, uti, hypoglycemia, cva, drug effect, withdrawal, encephalitis   Prudencio PairKatie F Normoyle is a 84 y.o. who presents to the ED with dental described above.  Patient does seem encephalopathic but no focal neuro deficits.  Most likely metabolic or due to underlying fever and sepsis.  She febrile and tachycardic with A. fib with RVR but fortunately normotensive and well-perfused.  Will initiate septic work-up.  Her abdominal exam soft and benign.  The patient will be placed on continuous pulse oximetry and telemetry for monitoring.   Laboratory evaluation will be sent to evaluate for the above complaints.     Clinical Course as of Sep 03 1347  Fri Sep 02, 2017  1216 X-ray shows evidence  of probable pneumonia to explain the patient's presentation.  She is receiving IV fluids for resuscitation and her heart rate is improving.  Has been started on broad-spectrum antibiotics.  Patient will likely require admission to the hospital for further medical management.   [PR]    Clinical Course User Index [PR] Willy Eddy, MD     As part of my medical decision making, I reviewed the following data within the electronic MEDICAL RECORD NUMBER Nursing notes reviewed and incorporated, Labs reviewed, notes from prior ED visits and [place] Controlled Substance Database   ____________________________________________   FINAL CLINICAL IMPRESSION(S) / ED DIAGNOSES  Final diagnoses:  HCAP (healthcare-associated pneumonia)  Acute encephalopathy  Sepsis, due to unspecified organism [facility])  Atrial fibrillation with rapid ventricular response (HCC)      NEW MEDICATIONS STARTED DURING THIS VISIT:  New Prescriptions   No medications on file     Note:  This document was prepared using Dragon voice recognition software and may include unintentional dictation errors.    Willy Eddy, MD 09/02/17 1349

## 2017-09-02 NOTE — Progress Notes (Signed)
Pharmacy Antibiotic Note  Denise Cervantes is a 84 y.o. female admitted on 09/02/2017 with pneumonia.  Pharmacy has been consulted for cefepime and vancomycin dosing.  Plan: Cefepime 2 g iv q 12 hours.   Vancomycin 1000 mg iv once followed by 750 mg iv q 24 hours with stacked dosing and a trough with the 4th dose. Will f/u MRSA PCR.   Height: 5\' 2"  (157.5 cm) Weight: 132 lb (59.9 kg) IBW/kg (Calculated) : 50.1  Temp (24hrs), Avg:99.6 F (37.6 C), Min:98 F (36.7 C), Max:101.4 F (38.6 C)  Recent Labs  Lab 09/02/17 1122  WBC 15.4*  CREATININE 0.67  LATICACIDVEN 1.1    Estimated Creatinine Clearance: 37.7 mL/min (by C-G formula based on SCr of 0.67 mg/dL).    Allergies  Allergen Reactions  . Penicillins     Antimicrobials this admission: vancomycin 4/26 >>  Cefepime 4/26 >>   Dose adjustments this admission:   Microbiology results: 4/26 BCx: sent 4/26 UCx: sent  4/26 MRSA PCR: sent  Thank you for allowing pharmacy to be a part of this patient's care.  Valentina GuChristy, Alexis Mizuno D 09/02/2017 3:46 PM

## 2017-09-02 NOTE — H&P (Signed)
[facility] - [place] at [facility]   PATIENT NAME: Denise Cervantes    MR#:  [id number]  DATE OF BIRTH:  [birth year]  DATE OF ADMISSION:  09/02/2017  PRIMARY CARE PHYSICIAN: Marisue Ivan, MD   REQUESTING/REFERRING PHYSICIAN:   CHIEF COMPLAINT:   Chief Complaint  Patient presents with  . Weakness  . Cough    HISTORY OF PRESENT ILLNESS: Denise Cervantes  is a 84 y.o. female with a known history of atrial fibrillation, diabetes mellitus type 2, GERD, COPD, hypertension, hyperlipidemia he is a resident of [facility].  Patient has a generalized weakness and cough.  Patient was little lethargic and confused when she was brought to the emergency room.  Patient was also febrile at the facility. she received some IV fluids and IV antibiotics in the emergency room.  Chest x-ray revealed pneumonia.  Lactic acid level was 1.1.  Her mental status is improved and cough looking better.  PAST MEDICAL HISTORY:   Past Medical History:  Diagnosis Date  . A-fib (HCC)   . Bronchitis, obstructive, chronic (HCC)   . COPD exacerbation (HCC)   . Depression   . Diabetes (HCC)    type II  . Esophageal reflux disease   . Fibromyalgia   . Glaucoma   . HBP (high blood pressure)   . Hypercholesterolemia   . Hypertension   . Memory loss     PAST SURGICAL HISTORY: History reviewed. No pertinent surgical history.  SOCIAL HISTORY:  Social History   Tobacco Use  . Smoking status: Former Smoker    Types: Cigarettes  . Smokeless tobacco: Never Used  Substance Use Topics  . Alcohol use: No    FAMILY HISTORY: No family history on file.  DRUG ALLERGIES:  Allergies  Allergen Reactions  . Penicillins     REVIEW OF SYSTEMS:   CONSTITUTIONAL: Has fever, has fatigue and weakness.  EYES: No blurred or double vision.  EARS, NOSE, AND THROAT: No tinnitus or ear pain.  RESPIRATORY: Has cough, shortness of breath,  No wheezing or hemoptysis.  CARDIOVASCULAR: No chest  pain, orthopnea, edema.  GASTROINTESTINAL: No nausea, vomiting, diarrhea or abdominal pain.  GENITOURINARY: No dysuria, hematuria.  ENDOCRINE: No polyuria, nocturia,  HEMATOLOGY: No anemia, easy bruising or bleeding SKIN: No rash or lesion. MUSCULOSKELETAL: No joint pain or arthritis.   NEUROLOGIC: No tingling, numbness, weakness.  PSYCHIATRY: No anxiety or depression.   MEDICATIONS AT HOME:  Prior to Admission medications   Medication Sig Start Date End Date Taking? Authorizing Provider  acetaminophen (TYLENOL) 500 MG tablet Take 1,000 mg by mouth every 8 (eight) hours as needed for mild pain.    Yes [provider]  amLODipine (NORVASC) 5 MG tablet Take 5 mg by mouth daily.   Yes [provider]  apixaban (ELIQUIS) 5 MG TABS tablet Take 5 mg by mouth every 12 (twelve) hours.    Yes [provider]  azithromycin (ZITHROMAX) 250 MG tablet Take 2 tablets by mouth day 1 then take 1 tablet by mouth daily next 4 days 09/01/17 09/06/17 Yes [provider]  Biotin 5 MG CAPS Take 1 capsule by mouth daily.   Yes [provider]  brimonidine-timolol (COMBIGAN) 0.2-0.5 % ophthalmic solution Place 1 drop into the right eye 2 (two) times daily.   Yes [provider]  Calcium Carbonate Antacid 400 MG CHEW Chew 400 mg by mouth daily as needed (stomach acid).    Yes [provider]  Cholecalciferol 1000  units capsule Take 1,000 Units by mouth daily.    Yes [provider]  CIPRODEX OTIC suspension Place 4 drops into the right ear 2 (two) times daily. 09/01/17 09/08/17 Yes [provider]  escitalopram (LEXAPRO) 5 MG tablet Take 1 tablet by mouth daily. 08/26/17  Yes [provider]  latanoprost (XALATAN) 0.005 % ophthalmic solution Place 1 drop into both eyes at bedtime.   Yes [provider]  losartan (COZAAR) 100 MG tablet Take 100 mg by mouth daily.   Yes [provider]  metFORMIN (GLUCOPHAGE) 500 MG  tablet Take 500 mg by mouth 2 (two) times daily with a meal.   Yes [provider]  rosuvastatin (CRESTOR) 5 MG tablet Take 5 mg by mouth daily. 08/25/17  Yes [provider]  traZODone (DESYREL) 50 MG tablet Take 50 mg by mouth at bedtime as needed for sleep.    Yes [provider]  oxyCODONE-acetaminophen (PERCOCET) 5-325 MG tablet Take 1-2 tablets by mouth every 6 (six) hours as needed. Patient not taking: Reported on 05/31/2017 09/23/16   Emily Filbert, MD      PHYSICAL EXAMINATION:   VITAL SIGNS: Blood pressure 118/66, pulse 75, temperature (!) 101.4 F (38.6 C), temperature source Rectal, resp. rate 15, height 5\' 2"  (1.575 m), weight 59.9 kg (132 lb), SpO2 96 %.  GENERAL:  84 y.o.-year-old patient lying in the bed with no acute distress.  EYES: Pupils equal, round, reactive to light and accommodation. No scleral icterus. Extraocular muscles intact.  HEENT: Head atraumatic, normocephalic. Oropharynx and nasopharynx clear.  NECK:  Supple, no jugular venous distention. No thyroid enlargement, no tenderness.  LUNGS: Decreased breath sounds bilaterally, rales heard in right lung. No use of accessory muscles of respiration.  CARDIOVASCULAR: S1, S2 normal. No murmurs, rubs, or gallops.  ABDOMEN: Soft, nontender, nondistended. Bowel sounds present. No organomegaly or mass.  EXTREMITIES: No pedal edema, cyanosis, or clubbing.  NEUROLOGIC: Cranial nerves II through XII are intact. Muscle strength 5/5 in all extremities. Sensation intact. Gait not checked.  PSYCHIATRIC: The patient is alert and oriented x 3.  SKIN: No obvious rash, lesion, or ulcer.   LABORATORY PANEL:   CBC Recent Labs  Lab 09/02/17 1122  WBC 15.4*  HGB 11.6*  HCT 34.5*  PLT 185  MCV 92.3  MCH 31.0  MCHC 33.6  RDW 14.1  LYMPHSABS 1.4  MONOABS 1.8*  EOSABS 0.0  BASOSABS 0.1    ------------------------------------------------------------------------------------------------------------------  Chemistries  Recent Labs  Lab 09/02/17 1122  NA 131*  K 3.2*  CL 102  CO2 19*  GLUCOSE 157*  BUN 13  CREATININE 0.67  CALCIUM 7.6*  AST 16  ALT 13*  ALKPHOS 47  BILITOT 1.0   ------------------------------------------------------------------------------------------------------------------ estimated creatinine clearance is 37.7 mL/min (by C-G formula based on SCr of 0.67 mg/dL). ------------------------------------------------------------------------------------------------------------------ No results for input(s): TSH, T4TOTAL, T3FREE, THYROIDAB in the last 72 hours.  Invalid input(s): FREET3   Coagulation profile No results for input(s): INR, PROTIME in the last 168 hours. ------------------------------------------------------------------------------------------------------------------- No results for input(s): DDIMER in the last 72 hours. -------------------------------------------------------------------------------------------------------------------  Cardiac Enzymes Recent Labs  Lab 09/02/17 1122  TROPONINI <0.03   ------------------------------------------------------------------------------------------------------------------ Invalid input(s): POCBNP  ---------------------------------------------------------------------------------------------------------------  Urinalysis    Component Value Date/Time   COLORURINE YELLOW (A) 09/02/2017 1122   APPEARANCEUR CLEAR (A) 09/02/2017 1122   LABSPEC 1.014 09/02/2017 1122   PHURINE 7.0 09/02/2017 1122   GLUCOSEU NEGATIVE 09/02/2017 1122   HGBUR NEGATIVE 09/02/2017 1122   BILIRUBINUR NEGATIVE  09/02/2017 1122   KETONESUR NEGATIVE 09/02/2017 1122   PROTEINUR 100 (A) 09/02/2017 1122   NITRITE NEGATIVE 09/02/2017 1122   LEUKOCYTESUR NEGATIVE 09/02/2017 1122     RADIOLOGY: Dg Chest 2  View  Result Date: 09/02/2017 CLINICAL DATA:  ear infection and prescribed azithromycin and cipro that pt started last night. Pt woke up today feeling more weak and called her daughter who called EMS. Upon arrival to ED pt confused. EMS reports reports identical presentation to her infection "a few weeks ago." Hx of AFIB, bronchitis, COPD, diabetes, fibromyalgia, hypertension. Former smoker EXAM: CHEST - 2 VIEW COMPARISON:  none FINDINGS: Coarse attenuated perihilar and bibasilar bronchovascular markings. Suspected lingular infiltrate, partial obscuring left heart border. Heart size and mediastinal contours are within normal limits. Aortic Atherosclerosis (ICD10-170.0) No effusion. Mild degenerative changes in bilateral shoulders. IMPRESSION: 1. Suspected lingular infiltrate. Consider follow-up to confirm appropriate resolution. Electronically Signed   By: Corlis Leak  Hassell M.D.   On: 09/02/2017 12:24    EKG: No orders found for this or any previous visit.  IMPRESSION AND PLAN:  84 year old female patient with history of COPD, atrial fibrillation, diabetes mellitus type 2, hypertension, hyperlipidemia presented to the emergency room for weakness, lethargy and cough.  Healthcare associated pneumonia Start patient on vancomycin and cefepime antibiotics intravenously  Hypokalemia Replace potassium orally  Hyponatremia IV fluid hydration  Chronic atrial fibrillation Continue rate control meds  DVT prophylaxis subcu Lovenox 40 daily  All the records are reviewed and case discussed with ED provider. Management plans discussed with the patient, family and they are in agreement.  CODE STATUS: DNR    Code Status Orders  (From admission, onward)        Start     Ordered   09/02/17 1425  Do not attempt resuscitation (DNR)  Continuous    Question Answer Comment  In the event of cardiac or respiratory ARREST Do not call a "code blue"   In the event of cardiac or respiratory ARREST Do not perform  Intubation, CPR, defibrillation or ACLS   In the event of cardiac or respiratory ARREST Use medication by any route, position, wound care, and other measures to relive pain and suffering. May use oxygen, suction and manual treatment of airway obstruction as needed for comfort.      09/02/17 1424    Code Status History    This patient has a current code status but no historical code status.       TOTAL TIME TAKING CARE OF THIS PATIENT: 52 minutes.    Ihor AustinPavan Tihanna Goodson M.D on 09/02/2017 at 2:26 PM  Between 7am to 6pm - Pager - [phone]  After 6pm go to [email or web address] - password [email or web address] [facility]  [facility]  Office  [phone]  CC: Primary care physician; Marisue IvanLinthavong, [place], MD

## 2017-09-02 NOTE — Progress Notes (Signed)

## 2017-09-02 NOTE — ED Triage Notes (Signed)
PT arrived via ems from the independent living at [facility]. Yesterday pt reported that she didn't feel good and was seen at [facility]. KC discovered a ear infection and prescribed azithromycin and cipro that pt started last night. Pt woke up today feeling more weak and called her daughter who called EMS. Upon arrival to ED pt confused. EMS reports reports identical presentation to her infection "a few weeks ago." No facial droop, no slurred speech, grips equal and strong.

## 2017-09-03 LAB — URINE CULTURE: Culture: NO GROWTH

## 2017-09-03 LAB — BASIC METABOLIC PANEL
Anion gap: 6 (ref 5–15)
BUN: 10 mg/dL (ref 6–20)
CALCIUM: 8 mg/dL — AB (ref 8.9–10.3)
CO2: 22 mmol/L (ref 22–32)
CREATININE: 0.62 mg/dL (ref 0.44–1.00)
Chloride: 103 mmol/L (ref 101–111)
GFR calc Af Amer: >60 mL/min (ref >60)
GFR calc non Af Amer: >60 mL/min (ref >60)
GLUCOSE: 167 mg/dL — AB (ref 65–99)
Potassium: 3.7 mmol/L (ref 3.5–5.1)
Sodium: 131 mmol/L — ABNORMAL LOW (ref 135–145)

## 2017-09-03 LAB — CBC
HCT: 35 % (ref 35.0–47.0)
Hemoglobin: 11.8 g/dL — ABNORMAL LOW (ref 12.0–16.0)
MCH: 31 pg (ref 26.0–34.0)
MCHC: 33.7 g/dL (ref 32.0–36.0)
MCV: 92 fL (ref 80.0–100.0)
PLATELETS: 182 10*3/uL (ref 150–440)
RBC: 3.81 MIL/uL (ref 3.80–5.20)
RDW: 14 % (ref 11.5–14.5)
WBC: 14 10*3/uL — ABNORMAL HIGH (ref 3.6–11.0)

## 2017-09-03 LAB — GLUCOSE, CAPILLARY
Glucose-Capillary: 210 mg/dL — ABNORMAL HIGH (ref 65–99)
Glucose-Capillary: 195 mg/dL — ABNORMAL HIGH (ref 65–99)
Glucose-Capillary: 250 mg/dL — ABNORMAL HIGH (ref 65–99)
Glucose-Capillary: 195 mg/dL — ABNORMAL HIGH (ref 65–99)
Glucose-Capillary: 193 mg/dL — ABNORMAL HIGH (ref 65–99)

## 2017-09-03 MED ORDER — SODIUM CHLORIDE 0.9 % IV SOLN: 1.0000 g | INTRAVENOUS | Status: DC

## 2017-09-03 MED ORDER — METHYLPREDNISOLONE SODIUM SUCC 125 MG IJ SOLR
80.0000 mg | Freq: Two times a day (BID) | INTRAMUSCULAR | Status: DC
  Administered 2017-09-03 (×2): 80 mg via INTRAVENOUS
  Filled 2017-09-03 (×2): qty 2

## 2017-09-03 MED ORDER — LEVALBUTEROL HCL 0.63 MG/3ML IN NEBU
0.6300 mg | INHALATION_SOLUTION | Freq: Four times a day (QID) | RESPIRATORY_TRACT | Status: DC
  Administered 2017-09-03 – 2017-09-04 (×4): 0.63 mg via RESPIRATORY_TRACT
  Filled 2017-09-03 (×7): qty 3

## 2017-09-03 MED ORDER — MUSCLE RUB 10-15 % EX CREA: TOPICAL_CREAM | CUTANEOUS | Status: DC | PRN

## 2017-09-03 MED ORDER — DILTIAZEM HCL 30 MG PO TABS
30.0000 mg | ORAL_TABLET | Freq: Four times a day (QID) | ORAL | Status: DC
  Administered 2017-09-03 (×3): 30 mg via ORAL
  Filled 2017-09-03 (×6): qty 1

## 2017-09-03 MED ORDER — IPRATROPIUM BROMIDE 0.02 % IN SOLN
0.5000 mg | Freq: Four times a day (QID) | RESPIRATORY_TRACT | Status: DC
  Administered 2017-09-03 – 2017-09-04 (×5): 0.5 mg via RESPIRATORY_TRACT
  Filled 2017-09-03 (×6): qty 2.5

## 2017-09-03 MED ORDER — TROLAMINE SALICYLATE 10 % EX CREA
TOPICAL_CREAM | CUTANEOUS | Status: DC | PRN
  Filled 2017-09-03: qty 85

## 2017-09-03 MED ORDER — AMIODARONE HCL 200 MG PO TABS
200.0000 mg | ORAL_TABLET | Freq: Two times a day (BID) | ORAL | Status: DC
Start: 2017-09-03 — End: 2017-09-05
  Administered 2017-09-03 – 2017-09-05 (×5): 200 mg via ORAL
  Filled 2017-09-03 (×5): qty 1

## 2017-09-03 MED ORDER — SODIUM CHLORIDE 0.9 % IV SOLN
1.0000 g | Freq: Every day | INTRAVENOUS | Status: DC
  Administered 2017-09-03 – 2017-09-04 (×2): 1 g via INTRAVENOUS
  Filled 2017-09-03 (×3): qty 10

## 2017-09-03 MED ORDER — INSULIN ASPART 100 UNIT/ML ~~LOC~~ SOLN
0.0000 [IU] | Freq: Three times a day (TID) | SUBCUTANEOUS | Status: DC
  Administered 2017-09-03: 2 [IU] via SUBCUTANEOUS
  Administered 2017-09-03: 3 [IU] via SUBCUTANEOUS
  Administered 2017-09-03 – 2017-09-04 (×2): 2 [IU] via SUBCUTANEOUS
  Administered 2017-09-04: 3 [IU] via SUBCUTANEOUS
  Administered 2017-09-04 – 2017-09-05 (×2): 2 [IU] via SUBCUTANEOUS
  Filled 2017-09-03 (×3): qty 1

## 2017-09-03 MED ORDER — INSULIN ASPART 100 UNIT/ML ~~LOC~~ SOLN
0.0000 [IU] | Freq: Every day | SUBCUTANEOUS | Status: DC
  Administered 2017-09-03: 2 [IU] via SUBCUTANEOUS
  Administered 2017-09-04: 3 [IU] via SUBCUTANEOUS
  Filled 2017-09-03 (×2): qty 1

## 2017-09-03 MED ORDER — DOXYCYCLINE HYCLATE 100 MG PO TABS
100.0000 mg | ORAL_TABLET | Freq: Two times a day (BID) | ORAL | Status: DC
  Administered 2017-09-03 – 2017-09-05 (×5): 100 mg via ORAL
  Filled 2017-09-03 (×5): qty 1

## 2017-09-03 NOTE — Progress Notes (Signed)
Patient ID: Denise Cervantes, female   DOB: [birth year], 84 y.o.   MRN: [id number]  [facility] PROGRESS NOTE  STEPHANNY TSUTSUI WUJ:[id number] DOB: [birth year] DOA: 09/02/2017 PCP: Marisue Ivan, MD  HPI/Subjective: Patient feeling a lot better today.  Came in with atrial fibrillation rapid ventricular response and pneumonia.  Not much coughing.  Felt weak when she came in but feeling better today.  Objective: Vitals:   09/03/17 1202 09/03/17 1203  BP: 125/84   Pulse: 72 75  Resp:    Temp: (!) 97.5 F (36.4 C)   SpO2:  97%    Filed Weights   09/02/17 1101  Weight: 59.9 kg (132 lb)    ROS: Review of Systems  Constitutional: Negative for chills and fever.  Eyes: Negative for blurred vision.  Respiratory: Negative for cough and shortness of breath.   Cardiovascular: Negative for chest pain.  Gastrointestinal: Negative for abdominal pain, constipation, diarrhea, nausea and vomiting.  Genitourinary: Negative for dysuria.  Musculoskeletal: Negative for joint pain.  Neurological: Negative for dizziness and headaches.   Exam: Physical Exam  Constitutional: She is oriented to person, place, and time.  HENT:  Nose: No mucosal edema.  Mouth/Throat: No oropharyngeal exudate or posterior oropharyngeal edema.  Eyes: Pupils are equal, round, and reactive to light. Conjunctivae, EOM and lids are normal.  Neck: No JVD present. Carotid bruit is not present. No edema present. No thyroid mass and no thyromegaly present.  Cardiovascular: S1 normal and S2 normal. An irregularly irregular rhythm present. Exam reveals no gallop.  No murmur heard. Pulses:      Dorsalis pedis pulses are 2+ on the right side, and 2+ on the left side.  Respiratory: No respiratory distress. She has decreased breath sounds in the right lower field and the left lower field. She has no wheezes. She has no rhonchi. She has no rales.  GI: Soft. Bowel sounds are normal. There is no tenderness.  Musculoskeletal:        Right ankle: She exhibits no swelling.       Left ankle: She exhibits no swelling.  Lymphadenopathy:    She has no cervical adenopathy.  Neurological: She is alert and oriented to person, place, and time. No cranial nerve deficit.  Skin: Skin is warm. No rash noted. Nails show no clubbing.  Psychiatric: She has a normal mood and affect.      Data Reviewed: Basic Metabolic Panel: Recent Labs  Lab 09/02/17 1122 09/03/17 0432  NA 131* 131*  K 3.2* 3.7  CL 102 103  CO2 19* 22  GLUCOSE 157* 167*  BUN 13 10  CREATININE 0.67 0.62  CALCIUM 7.6* 8.0*   Liver Function Tests: Recent Labs  Lab 09/02/17 1122  AST 16  ALT 13*  ALKPHOS 47  BILITOT 1.0  PROT 6.2*  ALBUMIN 3.0*   CBC: Recent Labs  Lab 09/02/17 1122 09/03/17 0432  WBC 15.4* 14.0*  NEUTROABS 12.1*  --   HGB 11.6* 11.8*  HCT 34.5* 35.0  MCV 92.3 92.0  PLT 185 182   Cardiac Enzymes: Recent Labs  Lab 09/02/17 1122  TROPONINI <0.03    CBG: Recent Labs  Lab 09/03/17 0114 09/03/17 0726 09/03/17 1145  GLUCAP 210* 195* 250*    Recent Results (from the past 240 hour(s))  Blood Culture (routine x 2)     Status: None (Preliminary result)   Collection Time: 09/02/17 11:21 AM  Result Value Ref Range Status   Specimen Description BLOOD BLOOD RIGHT ARM  Final   Special Requests   Final    BOTTLES DRAWN AEROBIC AND ANAEROBIC Blood Culture results may not be optimal due to an excessive volume of blood received in culture bottles   Culture   Final    NO GROWTH < 24 HOURS Performed at [facility], [address] [place], [place] [address]    Report Status PENDING  Incomplete  Blood Culture (routine x 2)     Status: None (Preliminary result)   Collection Time: 09/02/17 11:21 AM  Result Value Ref Range Status   Specimen Description BLOOD LEFT ANTECUBITAL  Final   Special Requests   Final    BOTTLES DRAWN AEROBIC AND ANAEROBIC Blood Culture adequate volume   Culture   Final    NO GROWTH < 24  HOURS Performed at [facility], [address], [place], [place] [address]    Report Status PENDING  Incomplete  Urine culture     Status: None   Collection Time: 09/02/17 11:22 AM  Result Value Ref Range Status   Specimen Description   Final    URINE, RANDOM Performed at [facility], [address] [place], [place] [address]    Special Requests   Final    NONE Performed at [facility], [address], [place], [place] [address]    Culture   Final    NO GROWTH Performed at [facility], [address] [place], [place] [address]    Report Status 09/03/2017 FINAL  Final  MRSA PCR Screening     Status: None   Collection Time: 09/02/17  5:05 PM  Result Value Ref Range Status   MRSA by PCR NEGATIVE NEGATIVE Final    Comment:        The GeneXpert MRSA Assay (FDA approved for NASAL specimens only), is one component of a comprehensive MRSA colonization surveillance program. It is not intended to diagnose MRSA infection nor to guide or monitor treatment for MRSA infections. Performed at [facility], [address] [place], [place] [address]      Studies: Dg Chest 2 View  Result Date: 09/02/2017 CLINICAL DATA:  ear infection and prescribed azithromycin and cipro that pt started last night. Pt woke up today feeling more weak and called her daughter who called EMS. Upon arrival to ED pt confused. EMS reports reports identical presentation to her infection "a few weeks ago." Hx of AFIB, bronchitis, COPD, diabetes, fibromyalgia, hypertension. Former smoker EXAM: CHEST - 2 VIEW COMPARISON:  none FINDINGS: Coarse attenuated perihilar and bibasilar bronchovascular markings. Suspected lingular infiltrate, partial obscuring left heart border. Heart size and mediastinal contours are within normal limits. Aortic Atherosclerosis (ICD10-170.0) No effusion. Mild degenerative changes in bilateral shoulders. IMPRESSION: 1. Suspected lingular  infiltrate. Consider follow-up to confirm appropriate resolution. Electronically Signed   By: Corlis Leak M.D.   On: 09/02/2017 12:24    Scheduled Meds: . amiodarone  200 mg Oral BID  . amLODipine  5 mg Oral Daily  . apixaban  5 mg Oral Q12H  . brimonidine  1 drop Right Eye BID   And  . timolol  1 drop Right Eye BID  . cholecalciferol  1,000 Units Oral Daily  . ciprofloxacin-dexamethasone  4 drop Right EAR BID  . doxycycline  100 mg Oral Q12H  . escitalopram  5 mg Oral Daily  . insulin aspart  0-5 Units Subcutaneous QHS  . insulin aspart  0-9 Units Subcutaneous TID WC  . ipratropium  0.5 mg Nebulization Q6H  . latanoprost  1 drop Both Eyes QHS  . levalbuterol  0.63 mg Nebulization Q6H  . losartan  100 mg Oral Daily  . metFORMIN  500 mg Oral BID WC  . methylPREDNISolone (SOLU-MEDROL) injection  80 mg Intravenous Q12H  . rosuvastatin  5 mg Oral Daily  . sodium chloride flush  3 mL Intravenous Q12H   Continuous Infusions: . sodium chloride    . cefTRIAXone (ROCEPHIN)  IV Stopped (09/03/17 1402)    Assessment/Plan:  1. Clinical sepsis present on admission with community-acquired pneumonia, leukocytosis and fever.  Switch antibiotics to Rocephin and Zithromax. Procalcitonin elevated.  Drop dose of steroids down. 2. Acute hypoxic respiratory failure.  Pulse ox 88% with walking today.  Check it again tomorrow. 3. Atrial fibrillation with rapid ventricular response.  Patient given a dose of amiodarone and low-dose Cardizem.  Will place back on amiodarone and continue the Cardizem.  Patient already on Eliquis for anticoagulation. 4. Essential hypertension on Norvasc and losartan 5. Type 2 diabetes mellitus on metformin.  Sugars high secondary to steroids.  And on hemoglobin A1c 6. Hyperlipidemia unspecified on Crestor 7. Glaucoma on latanoprost and timolol 8. Weakness.  Physical therapy evaluation  Code Status:     Code Status Orders  (From admission, onward)        Start      Ordered   09/02/17 1425  Do not attempt resuscitation (DNR)  Continuous    Question Answer Comment  In the event of cardiac or respiratory ARREST Do not call a "code blue"   In the event of cardiac or respiratory ARREST Do not perform Intubation, CPR, defibrillation or ACLS   In the event of cardiac or respiratory ARREST Use medication by any route, position, wound care, and other measures to relive pain and suffering. May use oxygen, suction and manual treatment of airway obstruction as needed for comfort.      09/02/17 1424    Code Status History    This patient has a current code status but no historical code status.    Advance Directive Documentation     Most Recent Value  Type of Advance Directive  Healthcare Power of Attorney, Living will  Pre-existing out of facility DNR order (yellow form or pink MOST form)  -  "MOST" Form in Place?  -     Family Communication: Spoke with husband on the phone Disposition Plan: Potential disposition tomorrow  Antibiotics: -Rocephin -Doxycycline  Time spent: 28 minutes  Leontine Radman [employer]

## 2017-09-03 NOTE — Progress Notes (Signed)
Ambulated in hall 50 ft w/out O2, SpO2 went down to 88% (she was talking). I asked her to stop talking and take slow deep breaths, she did and O2 came up to 92%. Put O2 bNC 2L back on when we got back in bed.

## 2017-09-03 NOTE — Evaluation (Signed)
Physical Therapy Evaluation Patient Details Name: Denise Cervantes MRN: [id number] DOB: 10/05/1925 Today's Date: 09/03/2017   History of Present Illness  84 yo female with onset of PNA and recent ear infection was admitted with weakness and pulm infiltrate, encephalopathy.  PMHx: A-fib, HTN, COPD, fibromyalgia, DM, OA shoulders, bronchitis, glaucoma  Clinical Impression  Pt is up to walk with assistance and noted her balance and control of O2 sats fluctuates with her effort.  Will encourage her to be more active with walks in hosp but her need for assistance is likely to continue.  Will ask for help with SNF to increase her safety with mobility but pt is concerned about how to manage her husband's care during this time.  Her plan is to talk with her family to see how this can be handled.    Follow Up Recommendations SNF    Equipment Recommendations  Rolling walker with 5" wheels    Recommendations for Other Services       Precautions / Restrictions Precautions Precautions: Fall Precaution Comments: ck O2 sats with gait, using 2L O2 at home Restrictions Weight Bearing Restrictions: No      Mobility  Bed Mobility Overal bed mobility: Needs Assistance Bed Mobility: Supine to Sit;Sit to Supine     Supine to sit: Min assist Sit to supine: Min guard;Min assist   General bed mobility comments: pt using bedrails and needs cues for safety and sequencing  Transfers Overall transfer level: Needs assistance Equipment used: 1 person hand held assist;Rolling walker (2 wheeled) Transfers: Sit to/from Stand Sit to Stand: Min assist         General transfer comment: minor help to power up but then needs assistance to control balance with dynamic tasks  Ambulation/Gait Ambulation/Gait assistance: Min guard;Min assist Ambulation Distance (Feet): 70 Feet Assistive device: Rolling walker (2 wheeled);1 person hand held assist(IV pole for assist of O2 tank) Gait Pattern/deviations:  Step-through pattern;Step-to pattern;Decreased stride length;Wide base of support Gait velocity: reduced Gait velocity interpretation: <1.31 ft/sec, indicative of household ambulator General Gait Details: pt requires cues for direction and managing lines  Stairs            Wheelchair Mobility    Modified Rankin (Stroke Patients Only)       Balance Overall balance assessment: Needs assistance Sitting-balance support: Feet supported;Single extremity supported Sitting balance-Leahy Scale: Fair     Standing balance support: Bilateral upper extremity supported;During functional activity Standing balance-Leahy Scale: Poor Standing balance comment: reduced endurance and safety with pt coming close to obstacles with IV on R hand                             Pertinent Vitals/Pain Pain Assessment: No/denies pain    Home Living Family/patient expects to be discharged to:: Private residence Living Arrangements: Spouse/significant other Available Help at Discharge: Family;Friend(s);Available PRN/intermittently Type of Home: Apartment Home Access: Level entry     Home Layout: One level Home Equipment: Walker - 2 wheels;Shower seat - built in Additional Comments: 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    Prior Function Level of Independence: Independent with assistive device(s)         Comments: has the [employer] community support system     Higher education careers adviser        Extremity/Trunk Assessment   Upper Extremity Assessment Upper Extremity Assessment: Overall WFL for tasks assessed    Lower Extremity Assessment Lower Extremity Assessment: Generalized weakness  Cervical / Trunk Assessment Cervical / Trunk Assessment: Kyphotic  Communication   Communication: HOH  Cognition Arousal/Alertness: Awake/alert Behavior During Therapy: WFL for tasks assessed/performed Overall Cognitive Status: Impaired/Different from baseline Area of Impairment:  Safety/judgement;Awareness;Problem solving                         Safety/Judgement: Decreased awareness of safety;Decreased awareness of deficits Awareness: Intellectual Problem Solving: Requires verbal cues;Requires tactile cues General Comments: pt is unaware of her fall risk and is surprised at the plan to ask for rehab      General Comments General comments (skin integrity, edema, etc.): skin on R arm is weeping and has some blood coming from IV site, nursing removed    Exercises Other Exercises Other Exercises: strength is 4 to 4+ but unsteady   Assessment/Plan    PT Assessment Patient needs continued PT services  PT Problem List Decreased strength;Decreased range of motion;Decreased activity tolerance;Decreased balance;Decreased mobility;Decreased coordination;Decreased knowledge of use of DME;Decreased safety awareness;Cardiopulmonary status limiting activity       PT Treatment Interventions DME instruction;Gait training;Functional mobility training;Therapeutic activities;Therapeutic exercise;Balance training;Neuromuscular re-education;Patient/family education    PT Goals (Current goals can be found in the Care Plan section)  Acute Rehab PT Goals Patient Stated Goal: to walk safely and return to care for husband PT Goal Formulation: With patient Time For Goal Achievement: 09/17/17 Potential to Achieve Goals: Good    Frequency Min 2X/week   Barriers to discharge Decreased caregiver support pt is caregiver to her husband    Co-evaluation               AM-PAC PT "6 Clicks" Daily Activity  Outcome Measure Difficulty turning over in bed (including adjusting bedclothes, sheets and blankets)?: A Little Difficulty moving from lying on back to sitting on the side of the bed? : A Lot Difficulty sitting down on and standing up from a chair with arms (e.g., wheelchair, bedside commode, etc,.)?: Unable Help needed moving to and from a bed to chair (including a  wheelchair)?: A Little Help needed walking in hospital room?: A Little Help needed climbing 3-5 steps with a railing? : Total 6 Click Score: 13    End of Session Equipment Utilized During Treatment: Gait belt;Oxygen(O2 sats dropped to 90% with gait) Activity Tolerance: Patient limited by fatigue;Treatment limited secondary to medical complications (Comment) Patient left: in bed;with call bell/phone within reach;with bed alarm set Nurse Communication: Mobility status PT Visit Diagnosis: Muscle weakness (generalized) (M62.81);Unsteadiness on feet (R26.81);Difficulty in walking, not elsewhere classified (R26.2)    Time: 1610-9604 PT Time Calculation (min) (ACUTE ONLY): 33 min   Charges:   PT Evaluation $PT Eval Moderate Complexity: 1 Mod PT Treatments $Gait Training: 8-22 mins   PT G Codes:   PT G-Codes **NOT FOR INPATIENT CLASS** Functional Assessment Tool Used: AM-PAC 6 Clicks Basic Mobility    Ivar Drape 09/03/2017, 5:22 PM   Samul Dada, PT MS Acute Rehab Dept. Number: [facility] [id number] and [facility] [phone]

## 2017-09-04 LAB — GLUCOSE, CAPILLARY
GLUCOSE-CAPILLARY: 225 mg/dL — AB (ref 65–99)
GLUCOSE-CAPILLARY: 179 mg/dL — AB (ref 65–99)
GLUCOSE-CAPILLARY: 256 mg/dL — AB (ref 65–99)
Glucose-Capillary: 193 mg/dL — ABNORMAL HIGH (ref 65–99)

## 2017-09-04 MED ORDER — IPRATROPIUM BROMIDE 0.02 % IN SOLN
0.5000 mg | Freq: Three times a day (TID) | RESPIRATORY_TRACT | Status: DC
  Administered 2017-09-04 – 2017-09-05 (×3): 0.5 mg via RESPIRATORY_TRACT
  Filled 2017-09-04 (×4): qty 2.5

## 2017-09-04 MED ORDER — LEVALBUTEROL HCL 0.63 MG/3ML IN NEBU
0.6300 mg | INHALATION_SOLUTION | Freq: Three times a day (TID) | RESPIRATORY_TRACT | Status: DC
  Administered 2017-09-04 – 2017-09-05 (×3): 0.63 mg via RESPIRATORY_TRACT
  Filled 2017-09-04 (×5): qty 3

## 2017-09-04 MED ORDER — METHYLPREDNISOLONE SODIUM SUCC 40 MG IJ SOLR
40.0000 mg | Freq: Every day | INTRAMUSCULAR | Status: DC
  Administered 2017-09-04 – 2017-09-05 (×2): 40 mg via INTRAVENOUS
  Filled 2017-09-04 (×2): qty 1

## 2017-09-04 MED ORDER — IPRATROPIUM BROMIDE 0.02 % IN SOLN: 0.5000 mg | RESPIRATORY_TRACT | Status: DC | PRN

## 2017-09-04 NOTE — Progress Notes (Signed)
Called P.T. Spoke w/Sarah. Asked her if PT could see pt today, she said schedule full but if thye got an opening, they'd come see pt.

## 2017-09-04 NOTE — Consult Note (Signed)
[facility] Cardiology  CARDIOLOGY CONSULT NOTE  Patient ID: Denise Cervantes MRN: [id number] DOB/AGE: 1925/12/03 84 y.o.  Admit date: 09/02/2017 Referring Physician Renae Gloss Primary Physician [place] Primary Cardiologist Gwen Pounds Reason for Consultation atrial fibrillation  HPI: 84 year old female referred for evaluation of atrial fibrillation.  The patient has a history of excisional atrial fibrillation on Eliquis for stroke prevention.  She has been in her usual state of health until of admission when she was brought to [facility] emergency room with fever, cough, and generalized weakness.  Chest x-ray revealed annular infiltrate consistent with pneumonia.  Admission labs were notable for elevated white count 15,400.  She was tachycardic, ECG revealed atrial fibrillation a rate of 150 bpm.  Patient was started on amiodarone 200 mg twice daily overall good rate control, telemetry revealing atrial fibrillation at a rate of 83 bpm.  The patient denies chest pain or palpitations.  Review of systems complete and found to be negative unless listed above     Past Medical History:  Diagnosis Date  . A-fib (HCC)   . Bronchitis, obstructive, chronic (HCC)   . COPD exacerbation (HCC)   . Depression   . Diabetes (HCC)    type II  . Esophageal reflux disease   . Fibromyalgia   . Glaucoma   . HBP (high blood pressure)   . Hypercholesterolemia   . Hypertension   . Memory loss     History reviewed. No pertinent surgical history.  Medications Prior to Admission  Medication Sig Dispense Refill Last Dose  . acetaminophen (TYLENOL) 500 MG tablet Take 1,000 mg by mouth every 8 (eight) hours as needed for mild pain.    PRN at PRN  . amLODipine (NORVASC) 5 MG tablet Take 5 mg by mouth daily.   Taking  . apixaban (ELIQUIS) 5 MG TABS tablet Take 5 mg by mouth every 12 (twelve) hours.      Marland Kitchen azithromycin (ZITHROMAX) 250 MG tablet Take 2 tablets by mouth day 1 then take 1 tablet by mouth daily next 4 days     .  Biotin 5 MG CAPS Take 1 capsule by mouth daily.     . brimonidine-timolol (COMBIGAN) 0.2-0.5 % ophthalmic solution Place 1 drop into the right eye 2 (two) times daily.     . Calcium Carbonate Antacid 400 MG CHEW Chew 400 mg by mouth daily as needed (stomach acid).    PRN at PRN  . Cholecalciferol 1000 units capsule Take 1,000 Units by mouth daily.    Taking  . CIPRODEX OTIC suspension Place 4 drops into the right ear 2 (two) times daily.     Marland Kitchen escitalopram (LEXAPRO) 5 MG tablet Take 1 tablet by mouth daily.  1   . latanoprost (XALATAN) 0.005 % ophthalmic solution Place 1 drop into both eyes at bedtime.   Taking  . losartan (COZAAR) 100 MG tablet Take 100 mg by mouth daily.   Taking  . metFORMIN (GLUCOPHAGE) 500 MG tablet Take 500 mg by mouth 2 (two) times daily with a meal.   Taking  . rosuvastatin (CRESTOR) 5 MG tablet Take 5 mg by mouth daily.  3   . traZODone (DESYREL) 50 MG tablet Take 50 mg by mouth at bedtime as needed for sleep.    PRN at PRN  . oxyCODONE-acetaminophen (PERCOCET) 5-325 MG tablet Take 1-2 tablets by mouth every 6 (six) hours as needed. (Patient not taking: Reported on 05/31/2017) 20 tablet 0 Not Taking at Unknown time   Social History  Socioeconomic History  . Marital status: Married    Spouse name: Not on file  . Number of children: Not on file  . Years of education: Not on file  . Highest education level: Not on file  Occupational History  . Occupation: retired  Engineer, production  . Financial resource strain: Not on file  . Food insecurity:    Worry: Not on file    Inability: Not on file  . Transportation needs:    Medical: Not on file    Non-medical: Not on file  Tobacco Use  . Smoking status: Former Smoker    Types: Cigarettes  . Smokeless tobacco: Never Used  Substance and Sexual Activity  . Alcohol use: No  . Drug use: No  . Sexual activity: Not Currently  Lifestyle  . Physical activity:    Days per week: Not on file    Minutes per session: Not on  file  . Stress: Not on file  Relationships  . Social connections:    Talks on phone: Not on file    Gets together: Not on file    Attends religious service: Not on file    Active member of club or organization: Not on file    Attends meetings of clubs or organizations: Not on file    Relationship status: Not on file  . Intimate partner violence:    Fear of current or ex partner: Not on file    Emotionally abused: Not on file    Physically abused: Not on file    Forced sexual activity: Not on file  Other Topics Concern  . Not on file  Social History Narrative  . Not on file    History reviewed. No pertinent family history.    Review of systems complete and found to be negative unless listed above      PHYSICAL EXAM  General: Well developed, well nourished, in no acute distress HEENT:  Normocephalic and atramatic Neck:  No JVD.  Lungs: Clear bilaterally to auscultation and percussion. Heart: HRRR . Normal S1 and S2 without gallops or murmurs.  Abdomen: Bowel sounds are positive, abdomen soft and non-tender  Msk:  Back normal, normal gait. Normal strength and tone for age. Extremities: No clubbing, cyanosis or edema.   Neuro: Alert and oriented X 3. Psych:  Good affect, responds appropriately  Labs:   Lab Results  Component Value Date   WBC 14.0 (H) 09/03/2017   HGB 11.8 (L) 09/03/2017   HCT 35.0 09/03/2017   MCV 92.0 09/03/2017   PLT 182 09/03/2017    Recent Labs  Lab 09/02/17 1122 09/03/17 0432  NA 131* 131*  K 3.2* 3.7  CL 102 103  CO2 19* 22  BUN 13 10  CREATININE 0.67 0.62  CALCIUM 7.6* 8.0*  PROT 6.2*  --   BILITOT 1.0  --   ALKPHOS 47  --   ALT 13*  --   AST 16  --   GLUCOSE 157* 167*   Lab Results  Component Value Date   TROPONINI <0.03 09/02/2017   No results found for: CHOL No results found for: HDL No results found for: LDLCALC No results found for: TRIG No results found for: CHOLHDL No results found for: LDLDIRECT    Radiology:  Dg Chest 2 View  Result Date: 09/02/2017 CLINICAL DATA:  ear infection and prescribed azithromycin and cipro that pt started last night. Pt woke up today feeling more weak and called her daughter who called EMS. Upon arrival  to ED pt confused. EMS reports reports identical presentation to her infection "a few weeks ago." Hx of AFIB, bronchitis, COPD, diabetes, fibromyalgia, hypertension. Former smoker EXAM: CHEST - 2 VIEW COMPARISON:  none FINDINGS: Coarse attenuated perihilar and bibasilar bronchovascular markings. Suspected lingular infiltrate, partial obscuring left heart border. Heart size and mediastinal contours are within normal limits. Aortic Atherosclerosis (ICD10-170.0) No effusion. Mild degenerative changes in bilateral shoulders. IMPRESSION: 1. Suspected lingular infiltrate. Consider follow-up to confirm appropriate resolution. Electronically Signed   By: Corlis Leak M.D.   On: 09/02/2017 12:24    EKG: Atrial fibrillation at a rate of 150 bpm  ASSESSMENT AND PLAN:   1.  Atrial fibrillation with rapid ventricular rate, in the setting of pneumonia, endocrine hemogram medically stable, rate improved on amiodarone, on Eliquis for stroke prevention 2.  Pneumonia  Recommendations  1.  Continue current medications 2.  Continue Eliquis for stroke prevention 3.  Continue amiodarone for now 4.  No further cardiac diagnostics at this time  Signed: Marcina Millard MD,PhD, [facility] 09/04/2017, 10:52 AM

## 2017-09-04 NOTE — Progress Notes (Signed)
Walked in hall all the way around Nurses station w/out O2.  Her lowest Sp02 was 92% but it stayed mainly at 95%-that is a lot better than yesterday. Her HR was in hi 90's to 120 range while ambulating. When she came back to room she immediately got into bed and her Sp02 was 98% and her HR came down to 89. Will leave O2 off.

## 2017-09-04 NOTE — Progress Notes (Signed)
Patient ID: Denise Cervantes, female   DOB: Feb 23, 1926, 84 y.o.   MRN: [id number]  [employer] PROGRESS NOTE  Denise Cervantes:[id number] DOB: December 07, 1925 DOA: 09/02/2017 PCP: Marisue Ivan, MD  HPI/Subjective: Patient feeling better.  States she did well yesterday with physical therapy.  Not much cough.  Some wheeze.  Objective: Vitals:   09/04/17 1020 09/04/17 1352  BP: 134/85   Pulse: 70 (!) 101  Resp:  (!) 22  Temp:    SpO2:  94%    Filed Weights   09/02/17 1101  Weight: 59.9 kg (132 lb)    ROS: Review of Systems  Constitutional: Negative for chills and fever.  Eyes: Negative for blurred vision.  Respiratory: Positive for wheezing. Negative for cough and shortness of breath.   Cardiovascular: Negative for chest pain.  Gastrointestinal: Negative for abdominal pain, constipation, diarrhea, nausea and vomiting.  Genitourinary: Negative for dysuria.  Musculoskeletal: Negative for joint pain.  Neurological: Negative for dizziness and headaches.   Exam: Physical Exam  Constitutional: She is oriented to person, place, and time.  HENT:  Nose: No mucosal edema.  Mouth/Throat: No oropharyngeal exudate or posterior oropharyngeal edema.  Tympanic membrane bilateral erythema and bulging.  Eyes: Pupils are equal, round, and reactive to light. Conjunctivae, EOM and lids are normal.  Neck: No JVD present. Carotid bruit is not present. No edema present. No thyroid mass and no thyromegaly present.  Cardiovascular: S1 normal and S2 normal. An irregularly irregular rhythm present. Exam reveals no gallop.  No murmur heard. Pulses:      Dorsalis pedis pulses are 2+ on the right side, and 2+ on the left side.  Respiratory: No respiratory distress. She has decreased breath sounds in the right lower field and the left lower field. She has no wheezes. She has no rhonchi. She has no rales.  GI: Soft. Bowel sounds are normal. There is no tenderness.  Musculoskeletal:       Right  ankle: She exhibits no swelling.       Left ankle: She exhibits no swelling.  Lymphadenopathy:    She has no cervical adenopathy.  Neurological: She is alert and oriented to person, place, and time. No cranial nerve deficit.  Skin: Skin is warm. No rash noted. Nails show no clubbing.  Psychiatric: She has a normal mood and affect.      Data Reviewed: Basic Metabolic Panel: Recent Labs  Lab 09/02/17 1122 09/03/17 0432  NA 131* 131*  K 3.2* 3.7  CL 102 103  CO2 19* 22  GLUCOSE 157* 167*  BUN 13 10  CREATININE 0.67 0.62  CALCIUM 7.6* 8.0*   Liver Function Tests: Recent Labs  Lab 09/02/17 1122  AST 16  ALT 13*  ALKPHOS 47  BILITOT 1.0  PROT 6.2*  ALBUMIN 3.0*   CBC: Recent Labs  Lab 09/02/17 1122 09/03/17 0432  WBC 15.4* 14.0*  NEUTROABS 12.1*  --   HGB 11.6* 11.8*  HCT 34.5* 35.0  MCV 92.3 92.0  PLT 185 182   Cardiac Enzymes: Recent Labs  Lab 09/02/17 1122  TROPONINI <0.03    CBG: Recent Labs  Lab 09/03/17 1145 09/03/17 1643 09/03/17 2206 09/04/17 0731 09/04/17 1140  GLUCAP 250* 195* 193* 193* 225*    Recent Results (from the past 240 hour(s))  Blood Culture (routine x 2)     Status: None (Preliminary result)   Collection Time: 09/02/17 11:21 AM  Result Value Ref Range Status   Specimen Description BLOOD BLOOD RIGHT ARM  Final   Special Requests   Final    BOTTLES DRAWN AEROBIC AND ANAEROBIC Blood Culture results may not be optimal due to an excessive volume of blood received in culture bottles   Culture   Final    NO GROWTH 2 DAYS Performed at [facility], [address], [place], [place] [address]    Report Status PENDING  Incomplete  Blood Culture (routine x 2)     Status: None (Preliminary result)   Collection Time: 09/02/17 11:21 AM  Result Value Ref Range Status   Specimen Description BLOOD LEFT ANTECUBITAL  Final   Special Requests   Final    BOTTLES DRAWN AEROBIC AND ANAEROBIC Blood Culture adequate volume   Culture    Final    NO GROWTH 2 DAYS Performed at [facility], [address], [place], [place] [address]    Report Status PENDING  Incomplete  Urine culture     Status: None   Collection Time: 09/02/17 11:22 AM  Result Value Ref Range Status   Specimen Description   Final    URINE, RANDOM Performed at [facility], [address] [place], [place] [address]    Special Requests   Final    NONE Performed at [facility], [address], [place], [place] [address]    Culture   Final    NO GROWTH Performed at [facility], [address] [place], [place] [address]    Report Status 09/03/2017 FINAL  Final  MRSA PCR Screening     Status: None   Collection Time: 09/02/17  5:05 PM  Result Value Ref Range Status   MRSA by PCR NEGATIVE NEGATIVE Final    Comment:        The GeneXpert MRSA Assay (FDA approved for NASAL specimens only), is one component of a comprehensive MRSA colonization surveillance program. It is not intended to diagnose MRSA infection nor to guide or monitor treatment for MRSA infections. Performed at [facility], [address] [place], [place] [address]       Scheduled Meds: . amiodarone  200 mg Oral BID  . amLODipine  5 mg Oral Daily  . apixaban  5 mg Oral Q12H  . brimonidine  1 drop Right Eye BID   And  . timolol  1 drop Right Eye BID  . cholecalciferol  1,000 Units Oral Daily  . ciprofloxacin-dexamethasone  4 drop Right EAR BID  . doxycycline  100 mg Oral Q12H  . escitalopram  5 mg Oral Daily  . insulin aspart  0-5 Units Subcutaneous QHS  . insulin aspart  0-9 Units Subcutaneous TID WC  . ipratropium  0.5 mg Nebulization TID  . latanoprost  1 drop Both Eyes QHS  . levalbuterol  0.63 mg Nebulization TID  . losartan  100 mg Oral Daily  . metFORMIN  500 mg Oral BID WC  . methylPREDNISolone (SOLU-MEDROL) injection  40 mg Intravenous Daily  . rosuvastatin  5 mg Oral Daily  . sodium chloride flush  3 mL  Intravenous Q12H   Continuous Infusions: . sodium chloride    . cefTRIAXone (ROCEPHIN)  IV Stopped (09/04/17 1100)    Assessment/Plan:  1. Clinical sepsis present on admission with community-acquired pneumonia, leukocytosis and fever.  Switched antibiotics to Rocephin and Zithromax. Procalcitonin elevated. 2. Acute hypoxic respiratory failure.  Pulse ox 88%  with ambulation yesterday.  Patient able to come off oxygen today. 3. COPD exacerbation.  Restart Solu-Medrol 40 mg  IV daily 4. Atrial fibrillation with rapid ventricular response.  Patient on amiodarone 200 mg twice daily.  Patient already on Eliquis for anticoagulation.  Looking back at record it looks like she is chronic atrial fibrillation. 5. Essential hypertension on Norvasc and losartan 6. Type 2 diabetes mellitus on metformin.  Sugars high secondary to steroids.   order hemoglobin A1c 7. Hyperlipidemia unspecified on Crestor 8. Glaucoma on latanoprost and timolol 9. Weakness.  Physical therapy evaluation recommended rehab.  Hopefully can go home with home health.  Would like physical therapy to reevaluate today or tomorrow morning.  Code Status:     Code Status Orders  (From admission, onward)        Start     Ordered   09/02/17 1425  Do not attempt resuscitation (DNR)  Continuous    Question Answer Comment  In the event of cardiac or respiratory ARREST Do not call a "code blue"   In the event of cardiac or respiratory ARREST Do not perform Intubation, CPR, defibrillation or ACLS   In the event of cardiac or respiratory ARREST Use medication by any route, position, wound care, and other measures to relive pain and suffering. May use oxygen, suction and manual treatment of airway obstruction as needed for comfort.      09/02/17 1424    Code Status History    This patient has a current code status but no historical code status.    Advance Directive Documentation     Most Recent Value  Type of Advance Directive   Healthcare Power of Attorney, Living will  Pre-existing out of facility DNR order (yellow form or pink MOST form)  -  "MOST" Form in Place?  -     Family Communication: Spoke with son at the bedside. Disposition Plan:  reevaluate the only on when to go home.  Antibiotics: -Rocephin -Doxycycline  Time spent: 28 minutes  Pamela Maddy [employer]

## 2017-09-05 LAB — BASIC METABOLIC PANEL
ANION GAP: 11 (ref 5–15)
BUN: 22 mg/dL — AB (ref 6–20)
CALCIUM: 8.6 mg/dL — AB (ref 8.9–10.3)
CO2: 21 mmol/L — ABNORMAL LOW (ref 22–32)
Chloride: 105 mmol/L (ref 101–111)
Creatinine, Ser: 0.61 mg/dL (ref 0.44–1.00)
GFR calc Af Amer: >60 mL/min (ref >60)
GLUCOSE: 149 mg/dL — AB (ref 65–99)
Potassium: 4 mmol/L (ref 3.5–5.1)
Sodium: 137 mmol/L (ref 135–145)

## 2017-09-05 LAB — GLUCOSE, CAPILLARY
GLUCOSE-CAPILLARY: 162 mg/dL — AB (ref 65–99)
Glucose-Capillary: 159 mg/dL — ABNORMAL HIGH (ref 65–99)

## 2017-09-05 LAB — HEMOGLOBIN A1C
Hgb A1c MFr Bld: 6.6 % — ABNORMAL HIGH (ref 4.8–5.6)
Mean Plasma Glucose: 142.72 mg/dL

## 2017-09-05 MED ORDER — TIOTROPIUM BROMIDE MONOHYDRATE 18 MCG IN CAPS: 18.0000 ug | ORAL_CAPSULE | Freq: Every day | RESPIRATORY_TRACT | 0 refills | Status: AC

## 2017-09-05 MED ORDER — AMIODARONE HCL 200 MG PO TABS: ORAL_TABLET | ORAL | 0 refills | Status: AC

## 2017-09-05 MED ORDER — MOMETASONE FURO-FORMOTEROL FUM 100-5 MCG/ACT IN AERO: 2.0000 | INHALATION_SPRAY | Freq: Two times a day (BID) | RESPIRATORY_TRACT | 0 refills | Status: AC

## 2017-09-05 MED ORDER — ALBUTEROL SULFATE HFA 108 (90 BASE) MCG/ACT IN AERS
2.0000 | INHALATION_SPRAY | Freq: Four times a day (QID) | RESPIRATORY_TRACT | 0 refills | Status: AC | PRN
Start: 2017-09-05 — End: ?

## 2017-09-05 MED ORDER — PREDNISONE 10 MG PO TABS: ORAL_TABLET | ORAL | 0 refills | Status: DC

## 2017-09-05 MED ORDER — DOXYCYCLINE HYCLATE 100 MG PO TABS: 100.0000 mg | ORAL_TABLET | Freq: Two times a day (BID) | ORAL | 0 refills | Status: DC

## 2017-09-05 NOTE — Discharge Summary (Signed)
[facility] - [place] at [facility]   PATIENT NAME: Denise Cervantes    MR#:  [id number]  DATE OF BIRTH:  1925-11-15  DATE OF ADMISSION:  09/02/2017 ADMITTING PHYSICIAN: Ihor Austin, MD  DATE OF DISCHARGE: 09/05/2017  PRIMARY CARE PHYSICIAN: Marisue Ivan, MD    ADMISSION DIAGNOSIS:  Atrial fibrillation with rapid ventricular response (HCC) [I48.91] Acute encephalopathy [G93.40] HCAP (healthcare-associated pneumonia) [J18.9] Sepsis, due to unspecified organism (HCC) [A41.9]  DISCHARGE DIAGNOSIS:  Active Problems:   Pneumonia   SECONDARY DIAGNOSIS:   Past Medical History:  Diagnosis Date  . A-fib (HCC)   . Bronchitis, obstructive, chronic (HCC)   . COPD exacerbation (HCC)   . Depression   . Diabetes (HCC)    type II  . Esophageal reflux disease   . Fibromyalgia   . Glaucoma   . HBP (high blood pressure)   . Hypercholesterolemia   . Hypertension   . Memory loss     HOSPITAL COURSE:   1.  Clinical sepsis present on admission with community-acquired pneumonia, leukocytosis and fever.  Patient was given aggressive antibiotics on presentation and switched to Rocephin and doxycycline.  We will continue doxycycline upon discharge home.  Procalcitonin was elevated on presentation.  Pneumonia was treated with total 7 days of antibiotics. 2.  Acute hypoxic respiratory failure.  Pulse ox was 88% with ambulation 2 days ago.  Patient was able to come off the oxygen yesterday with ambulation. 3.  COPD exacerbation.  Continue Solu-Medrol today and switch over to prednisone for 3 more days.  Prescribed albuterol inhaler, Dulera and Spiriva upon going home. 4.  Atrial fibrillation with rapid ventricular response.  Continue amiodarone 200 mg p.o. twice a day for 1 more week then drop down to 200 mg daily.  Continue Eliquis for anticoagulation.  Patient has chronic atrial fibrillation. 5.  Essential hypertension on Norvasc and losartan 6.  Type 2 diabetes mellitus on  metformin.  Sugars will come down once off steroids. 7.  Hyperlipidemia unspecified on Crestor lower dose. 8.  Glaucoma on latanoprost and timolol 9.  Weakness.  Physical therapy initially recommended rehab.  Patient walked around the nursing station well with nursing staff yesterday.  Patient concerned about going home with home health and interested in respite care at [facility] for a few days prior to going home.  Social worker to look into this.  Husband also put in respite care.  DISCHARGE CONDITIONS:   Satisfactory  CONSULTS OBTAINED:  Treatment Team:  Marcina Millard, MD  DRUG ALLERGIES:   Allergies  Allergen Reactions  . Penicillins     DISCHARGE MEDICATIONS:   Allergies as of 09/05/2017      Reactions   Penicillins       Medication List    STOP taking these medications   azithromycin 250 MG tablet Commonly known as:  ZITHROMAX   oxyCODONE-acetaminophen 5-325 MG tablet Commonly known as:  PERCOCET     TAKE these medications   acetaminophen 500 MG tablet Commonly known as:  TYLENOL Take 1,000 mg by mouth every 8 (eight) hours as needed for mild pain.   albuterol 108 (90 Base) MCG/ACT inhaler Commonly known as:  PROVENTIL HFA;VENTOLIN HFA Inhale 2 puffs into the lungs every 6 (six) hours as needed for wheezing or shortness of breath.   amiodarone 200 MG tablet Commonly known as:  PACERONE One tablet twice a day for seven days then one tablet daily   amLODipine 5 MG tablet Commonly known as:  NORVASC  Take 5 mg by mouth daily.   apixaban 5 MG Tabs tablet Commonly known as:  ELIQUIS Take 5 mg by mouth every 12 (twelve) hours.   Biotin 5 MG Caps Take 1 capsule by mouth daily.   Calcium Carbonate Antacid 400 MG Chew Chew 400 mg by mouth daily as needed (stomach acid).   Cholecalciferol 1000 units capsule Take 1,000 Units by mouth daily.   CIPRODEX OTIC suspension Generic drug:  ciprofloxacin-dexamethasone Place 4 drops into the right ear 2  (two) times daily.   COMBIGAN 0.2-0.5 % ophthalmic solution Generic drug:  brimonidine-timolol Place 1 drop into the right eye 2 (two) times daily.   doxycycline 100 MG tablet Commonly known as:  VIBRA-TABS Take 1 tablet (100 mg total) by mouth every 12 (twelve) hours.   escitalopram 5 MG tablet Commonly known as:  LEXAPRO Take 1 tablet by mouth daily.   latanoprost 0.005 % ophthalmic solution Commonly known as:  XALATAN Place 1 drop into both eyes at bedtime.   losartan 100 MG tablet Commonly known as:  COZAAR Take 100 mg by mouth daily.   metFORMIN 500 MG tablet Commonly known as:  GLUCOPHAGE Take 500 mg by mouth 2 (two) times daily with a meal.   mometasone-formoterol 100-5 MCG/ACT Aero Commonly known as:  DULERA Inhale 2 puffs into the lungs 2 (two) times daily.   predniSONE 10 MG tablet Commonly known as:  DELTASONE 4 tablets daily for three days   rosuvastatin 5 MG tablet Commonly known as:  CRESTOR Take 5 mg by mouth daily.   tiotropium 18 MCG inhalation capsule Commonly known as:  SPIRIVA HANDIHALER Place 1 capsule (18 mcg total) into inhaler and inhale daily.   traZODone 50 MG tablet Commonly known as:  DESYREL Take 50 mg by mouth at bedtime as needed for sleep.        DISCHARGE INSTRUCTIONS:   Follow-up PMD 6 days Follow-up with cardiology 2 weeks  If you experience worsening of your admission symptoms, develop shortness of breath, life threatening emergency, suicidal or homicidal thoughts you must seek medical attention immediately by calling 911 or calling your MD immediately  if symptoms less severe.  You Must read complete instructions/literature along with all the possible adverse reactions/side effects for all the Medicines you take and that have been prescribed to you. Take any new Medicines after you have completely understood and accept all the possible adverse reactions/side effects.   Please note  You were cared for by a hospitalist  during your hospital stay. If you have any questions about your discharge medications or the care you received while you were in the hospital after you are discharged, you can call the unit and asked to speak with the hospitalist on call if the hospitalist that took care of you is not available. Once you are discharged, your primary care physician will handle any further medical issues. Please note that NO REFILLS for any discharge medications will be authorized once you are discharged, as it is imperative that you return to your primary care physician (or establish a relationship with a primary care physician if you do not have one) for your aftercare needs so that they can reassess your need for medications and monitor your lab values.    Today   CHIEF COMPLAINT:   Chief Complaint  Patient presents with  . Weakness  . Cough    HISTORY OF PRESENT ILLNESS:  Yarelly Kuba  is a 84 y.o. female presented with weakness and cough  and found to have pneumonia   VITAL SIGNS:  Blood pressure (!) 152/74, pulse 89, temperature 98 F (36.7 C), temperature source Oral, resp. rate 20, height  (1.575 m), weight 59.9 kg (132 lb), SpO2 96 %.    PHYSICAL EXAMINATION:  GENERAL:  84 y.o.-year-old patient lying in the bed with no acute distress.  EYES: Pupils equal, round, reactive to light and accommodation. No scleral icterus. Extraocular muscles intact.  HEENT: Head atraumatic, normocephalic. Oropharynx and nasopharynx clear.  NECK:  Supple, no jugular venous distention. No thyroid enlargement, no tenderness.  LUNGS: Coarse breath sounds bilaterally, after coughing no wheezing, no rales,rhonchi or crepitation. No use of accessory muscles of respiration.  CARDIOVASCULAR: S1, S2 normal. No murmurs, rubs, or gallops.  ABDOMEN: Soft, non-tender, non-distended. Bowel sounds present. No organomegaly or mass.  EXTREMITIES: Trace pedal edema, no cyanosis, or clubbing.  NEUROLOGIC: Cranial nerves II  through XII are intact. Muscle strength 5/5 in all extremities. Sensation intact. Gait not checked.  PSYCHIATRIC: The patient is alert and oriented x 3.  SKIN: No obvious rash, lesion, or ulcer.   DATA REVIEW:   CBC Recent Labs  Lab 09/03/17 0432  WBC 14.0*  HGB 11.8*  HCT 35.0  PLT 182    Chemistries  Recent Labs  Lab 09/02/17 1122  09/05/17 0447  NA 131*   < > 137  K 3.2*   < > 4.0  CL 102   < > 105  CO2 19*   < > 21*  GLUCOSE 157*   < > 149*  BUN 13   < > 22*  CREATININE 0.67   < > 0.61  CALCIUM 7.6*   < > 8.6*  AST 16  --   --   ALT 13*  --   --   ALKPHOS 47  --   --   BILITOT 1.0  --   --    < > = values in this interval not displayed.    Cardiac Enzymes Recent Labs  Lab 09/02/17 1122  TROPONINI <0.03    Microbiology Results  Results for orders placed or performed during the hospital encounter of 09/02/17  Blood Culture (routine x 2)     Status: None (Preliminary result)   Collection Time: 09/02/17 11:21 AM  Result Value Ref Range Status   Specimen Description BLOOD BLOOD RIGHT ARM  Final   Special Requests   Final    BOTTLES DRAWN AEROBIC AND ANAEROBIC Blood Culture results may not be optimal due to an excessive volume of blood received in culture bottles   Culture   Final    NO GROWTH 2 DAYS Performed at [facility], [address] [place], [place] [address]    Report Status PENDING  Incomplete  Blood Culture (routine x 2)     Status: None (Preliminary result)   Collection Time: 09/02/17 11:21 AM  Result Value Ref Range Status   Specimen Description BLOOD LEFT ANTECUBITAL  Final   Special Requests   Final    BOTTLES DRAWN AEROBIC AND ANAEROBIC Blood Culture adequate volume   Culture   Final    NO GROWTH 2 DAYS Performed at [facility], [address] [place], [place] [address]    Report Status PENDING  Incomplete  Urine culture     Status: None   Collection Time: 09/02/17 11:22 AM  Result Value Ref Range Status    Specimen Description   Final    URINE, RANDOM Performed at [facility], [address] [place], [place] [address]    Special Requests   Final    NONE Performed at [facility], [address], [place], [place] [address]    Culture   Final    NO GROWTH Performed at [facility], [address] [place], [place] [address]    Report Status 09/03/2017 FINAL  Final  MRSA PCR Screening     Status: None   Collection Time: 09/02/17  5:05 PM  Result Value Ref Range Status   MRSA by PCR NEGATIVE NEGATIVE Final    Comment:        The GeneXpert MRSA Assay (FDA approved for NASAL specimens only), is one component of a comprehensive MRSA colonization surveillance program. It is not intended to diagnose MRSA infection nor to guide or monitor treatment for MRSA infections. Performed at [facility], [address], [place], [place] [address]     Management plans discussed with the patient, and she is in agreement.  Spoke with son yesterday at the bedside and he was interested in respite care  CODE STATUS:     Code Status Orders  (From admission, onward)        Start     Ordered   09/02/17 1425  Do not attempt resuscitation (DNR)  Continuous    Question Answer Comment  In the event of cardiac or respiratory ARREST Do not call a "code blue"   In the event of cardiac or respiratory ARREST Do not perform Intubation, CPR, defibrillation or ACLS   In the event of cardiac or respiratory ARREST Use medication by any route, position, wound care, and other measures to relive pain and suffering. May use oxygen, suction and manual treatment of airway obstruction as needed for comfort.      09/02/17 1424    Code Status History    This patient has a current code status but no historical code status.    Advance Directive Documentation     Most Recent Value  Type of Advance Directive  Healthcare Power of Attorney, Living will  Pre-existing out of facility  DNR order (yellow form or pink MOST form)  -  "MOST" Form in Place?  -      TOTAL TIME TAKING CARE OF THIS PATIENT: 35 minutes.    Alford Highland M.D on 09/05/2017 at 9:24 AM  Between 7am to 6pm - Pager - [phone]  After 6pm go to [email or web address] - password [email or web address]  [employer] Office  [phone]  CC: Primary care physician; Marisue Ivan, MD

## 2017-09-05 NOTE — Clinical Social Work Note (Addendum)
Patient to be d/c'ed today to [facility]. Patient and family agreeable to plans will transport via daughter's car RN to call report [phone] room [id number].  CSW spoke with patient's daughter and she is aware that patient is discharging today, she will transport patient.  Windell Moulding, MSW, Theresia Majors [phone]

## 2017-09-05 NOTE — Care Management Important Message (Signed)
Copy of signed IM left in patient's room.    

## 2017-09-05 NOTE — [place] FL2 (Signed)
[place] MEDICAID FL2 LEVEL OF CARE SCREENING TOOL     IDENTIFICATION  Patient Name: Denise Cervantes Birthdate: Feb 09, 1926 Sex: female Admission Date (Current Location): 09/02/2017  [place] and [place] Number:  Chiropodist and Address:  [facility], [address], [place], [place] [address]      Provider Number: [id number]  Attending Physician Name and Address:  Alford Highland, MD  Relative Name and Phone Number:  Dione Plover Daughter   [phone] or Telesia, Ates   [phone] or Kerra, [place]   [phone]     Current Level of Care: Hospital Recommended Level of Care: Skilled Nursing Facility Prior Approval Number:    Date Approved/Denied:   PASRR Number: [id number]  Discharge Plan: SNF    Current Diagnoses: Patient Active Problem List   Diagnosis Date Noted  . Pneumonia 09/02/2017    Orientation RESPIRATION BLADDER Height & Weight     Self, Time, Place  Normal Continent Weight: 132 lb (59.9 kg) Height:   (157.5 cm)  BEHAVIORAL SYMPTOMS/MOOD NEUROLOGICAL BOWEL NUTRITION STATUS      Continent Diet(Carb modified)  AMBULATORY STATUS COMMUNICATION OF NEEDS Skin   Limited Assist Verbally Normal                       Personal Care Assistance Level of Assistance  Bathing, Feeding, Dressing Bathing Assistance: Limited assistance Feeding assistance: Independent Dressing Assistance: Limited assistance     Functional Limitations Info  Sight, Hearing, Speech Sight Info: Adequate Hearing Info: Adequate Speech Info: Adequate    SPECIAL CARE FACTORS FREQUENCY  PT (By licensed PT)     PT Frequency: 5x a week              Contractures Contractures Info: Not present    Additional Factors Info  Code Status, Allergies, Psychotropic, Insulin Sliding Scale Code Status Info: DNR Allergies Info: PENICILLINS  Psychotropic Info: escitalopram (LEXAPRO) tablet 5 mg  Insulin Sliding Scale Info: insulin  aspart (novoLOG) injection 0-5 Units 3x a day with meals       Current Medications (09/05/2017):  This is the current hospital active medication list Current Facility-Administered Medications  Medication Dose Route Frequency Provider Last Rate Last Dose  . 0.9 %  sodium chloride infusion  250 mL Intravenous PRN Pyreddy, Vivien Rota, MD      . acetaminophen (TYLENOL) tablet 650 mg  650 mg Oral Q6H PRN Pyreddy, Vivien Rota, MD       Or  . acetaminophen (TYLENOL) suppository 650 mg  650 mg Rectal Q6H PRN Pyreddy, Vivien Rota, MD      . amiodarone (PACERONE) tablet 200 mg  200 mg Oral BID Alford Highland, MD   200 mg at 09/05/17 0946  . amLODipine (NORVASC) tablet 5 mg  5 mg Oral Daily Pyreddy, Vivien Rota, MD   5 mg at 09/05/17 0952  . apixaban (ELIQUIS) tablet 5 mg  5 mg Oral Q12H Pyreddy, Vivien Rota, MD   5 mg at 09/05/17 0947  . brimonidine (ALPHAGAN) 0.2 % ophthalmic solution 1 drop  1 drop Right Eye BID Ihor Austin, MD   1 drop at 09/05/17 0957   And  . timolol (TIMOPTIC) 0.5 % ophthalmic solution 1 drop  1 drop Right Eye BID Ihor Austin, MD   1 drop at 09/05/17 0957  . calcium carbonate (TUMS - dosed in mg elemental calcium) chewable tablet 500 mg  500 mg Oral Daily PRN Ihor Austin, MD      .  cefTRIAXone (ROCEPHIN) 1 g in sodium chloride 0.9 % 100 mL IVPB  1 g Intravenous Daily Alford Highland, MD   Stopped at 09/04/17 1100  . cholecalciferol (VITAMIN D) tablet 1,000 Units  1,000 Units Oral Daily Ihor Austin, MD   1,000 Units at 09/05/17 0947  . ciprofloxacin-dexamethasone (CIPRODEX) 0.3-0.1 % OTIC (EAR) suspension 4 drop  4 drop Right EAR BID Ihor Austin, MD   4 drop at 09/05/17 1001  . doxycycline (VIBRA-TABS) tablet 100 mg  100 mg Oral Q12H Alford Highland, MD   100 mg at 09/05/17 0946  . escitalopram (LEXAPRO) tablet 5 mg  5 mg Oral Daily Pyreddy, Vivien Rota, MD   5 mg at 09/05/17 0947  . insulin aspart (novoLOG) injection 0-5 Units  0-5 Units Subcutaneous QHS Cammy Copa, MD   3 Units at 09/04/17  2302  . insulin aspart (novoLOG) injection 0-9 Units  0-9 Units Subcutaneous TID WC Cammy Copa, MD   2 Units at 09/05/17 [phone]  . ipratropium (ATROVENT) nebulizer solution 0.5 mg  0.5 mg Nebulization TID Alford Highland, MD   0.5 mg at 09/05/17 0806  . ipratropium (ATROVENT) nebulizer solution 0.5 mg  0.5 mg Nebulization Q4H PRN Wieting, Richard, MD      . latanoprost (XALATAN) 0.005 % ophthalmic solution 1 drop  1 drop Both Eyes QHS Pyreddy, Vivien Rota, MD   1 drop at 09/04/17 2110  . levalbuterol (XOPENEX) nebulizer solution 0.63 mg  0.63 mg Nebulization TID Alford Highland, MD   0.63 mg at 09/05/17 0806  . losartan (COZAAR) tablet 100 mg  100 mg Oral Daily Pyreddy, Vivien Rota, MD   100 mg at 09/05/17 0951  . metFORMIN (GLUCOPHAGE) tablet 500 mg  500 mg Oral BID WC Pyreddy, Vivien Rota, MD   500 mg at 09/05/17 0907  . methylPREDNISolone sodium succinate (SOLU-MEDROL) 40 mg/mL injection 40 mg  40 mg Intravenous Daily Alford Highland, MD   40 mg at 09/05/17 0945  . ondansetron (ZOFRAN) tablet 4 mg  4 mg Oral Q6H PRN Ihor Austin, MD       Or  . ondansetron (ZOFRAN) injection 4 mg  4 mg Intravenous Q6H PRN Pyreddy, Pavan, MD      . rosuvastatin (CRESTOR) tablet 5 mg  5 mg Oral Daily Pyreddy, Vivien Rota, MD   5 mg at 09/05/17 0946  . senna-docusate (Senokot-S) tablet 1 tablet  1 tablet Oral QHS PRN Pyreddy, Vivien Rota, MD      . sodium chloride flush (NS) 0.9 % injection 3 mL  3 mL Intravenous Q12H Pyreddy, Pavan, MD   3 mL at 09/05/17 1001  . sodium chloride flush (NS) 0.9 % injection 3 mL  3 mL Intravenous PRN Pyreddy, Vivien Rota, MD      . traZODone (DESYREL) tablet 50 mg  50 mg Oral QHS PRN Ihor Austin, MD   50 mg at 09/04/17 2234  . trolamine salicylate (ASPERCREME) 10 % cream   Topical PRN Ihor Austin, MD         Discharge Medications: Please see discharge summary for a list of discharge medications.  Relevant Imaging Results:  Relevant Lab Results:   Additional Information SSN  [id number]  Darleene Cleaver, [place]

## 2017-09-05 NOTE — Clinical Social Work Note (Signed)
Clinical Social Work Assessment  Patient Details  Name: Denise Cervantes MRN: [id number] Date of Birth: 1926-01-02  Date of referral:  09/05/17               Reason for consult:  Facility Placement                Permission sought to share information with:  Family Supports, Magazine features editor Permission granted to share information::  Yes, Verbal Permission Granted  Name::     Dione Plover Daughter   [phone]   Agency::  SNF admissions  Relationship::     Contact Information:     Housing/Transportation Living arrangements for the past 2 months:  Independent Dealer of Information:  Medical Team, Adult Children, Patient Patient Interpreter Needed:  None Criminal Activity/Legal Involvement Pertinent to Current Situation/Hospitalization:  No - Comment as needed Significant Relationships:  Adult Children, Spouse Lives with:  Spouse Do you feel safe going back to the place where you live?  No Need for family participation in patient care:  No (Coment)  Care giving concerns:  Patient feels she needs some short term rehab before she is able to return back to indepedent living at [facility].   Social Worker assessment / plan:  Patient is a 84 year old female who is alert and oriented x4.  Patient states she has not been to the health care section before.  CSW explained to her the process for planning for discharge and how insurance will pay for her stay.  Patient is the caregiver for her husband, who will be getting into respite for a few days while she gets her rehab at [facility].  Patient was very talkative and pleasant.  Patient did not have any other questions or concerns, permission was given to send patient's information to [facility] for bed placement.  Employment status:  Retired Health and safety inspector:  Medicare PT Recommendations:  Skilled Nursing Facility Information / Referral to community resources:  Skilled Nursing Facility  Patient/Family's Response  to care:  Patient is in agreement to going to [facility].  Patient/Family's Understanding of and Emotional Response to Diagnosis, Current Treatment, and Prognosis: Patient expressed that she is hopeful that she will not have to be at [facility] for very long.  Patient is hoping she will only have to be at SNF for about 2 or 3 days.  Emotional Assessment Appearance:  Appears stated age Attitude/Demeanor/Rapport:    Affect (typically observed):  Appropriate, Calm Orientation:  Oriented to Self, Oriented to Place, Oriented to  Time, Oriented to Situation Alcohol / Substance use:  Not Applicable Psych involvement (Current and /or in the community):  No (Comment)  Discharge Needs  Concerns to be addressed:  Lack of Support Readmission within the last 30 days:  No Current discharge risk:  Lack of support system Barriers to Discharge:  No Barriers Identified   Darleene Cleaver, LCSWA 09/05/2017, 12:20 PM

## 2017-09-05 NOTE — Progress Notes (Signed)
Discharge order received. Patient is alert and oriented. Vital signs stable . No signs of acute distress. Discharge instructions given. Patient verbalized understanding. No other issues noted at this time. Report given to receiving nurse at [facility].

## 2017-09-05 NOTE — Clinical Social Work Placement (Signed)
   CLINICAL SOCIAL WORK PLACEMENT  NOTE  Date:  09/05/2017  Patient Details  Name: Denise Cervantes MRN: [id number] Date of Birth: 06/15/1925  Clinical Social Work is seeking post-discharge placement for this patient at the Skilled  Nursing Facility level of care (*CSW will initial, date and re-position this form in  chart as items are completed):  Yes   Patient/family provided with [place] Clinical Social Work Department's list of facilities offering this level of care within the geographic area requested by the patient (or if unable, by the patient's family).  Yes   Patient/family informed of their freedom to choose among providers that offer the needed level of care, that participate in Medicare, Medicaid or managed care program needed by the patient, have an available bed and are willing to accept the patient.  Yes   Patient/family informed of [facility]'s ownership interest in [facility] and [facility], as well as of the fact that they are under no obligation to receive care at these facilities.  PASRR submitted to EDS on 09/05/17     PASRR number received on 09/05/17     Existing PASRR number confirmed on       FL2 transmitted to all facilities in geographic area requested by pt/family on 09/05/17     FL2 transmitted to all facilities within larger geographic area on       Patient informed that his/her managed care company has contracts with or will negotiate with certain facilities, including the following:        Yes   Patient/family informed of bed offers received.  Patient chooses bed at [facility]     Physician recommends and patient chooses bed at      Patient to be transferred to [facility] on 09/05/17.  Patient to be transferred to facility by Daughter's car     Patient family notified on 09/05/17 of transfer.  Name of family member notified:  Daughter Dione Plover     PHYSICIAN Please sign DNR, Please sign FL2     Additional Comment:     _______________________________________________ Darleene Cleaver, LCSWA 09/05/2017, 12:40 PM

## 2017-09-06 DIAGNOSIS — I481 Persistent atrial fibrillation: Secondary | ICD-10-CM | POA: Diagnosis not present

## 2017-09-06 DIAGNOSIS — J439 Emphysema, unspecified: Secondary | ICD-10-CM

## 2017-09-06 DIAGNOSIS — E119 Type 2 diabetes mellitus without complications: Secondary | ICD-10-CM

## 2017-09-06 DIAGNOSIS — J189 Pneumonia, unspecified organism: Secondary | ICD-10-CM | POA: Diagnosis not present

## 2017-09-06 DIAGNOSIS — F39 Unspecified mood [affective] disorder: Secondary | ICD-10-CM | POA: Diagnosis not present

## 2017-09-07 LAB — CULTURE, BLOOD (ROUTINE X 2)
CULTURE: NO GROWTH
Culture: NO GROWTH
SPECIAL REQUESTS: ADEQUATE

## 2017-11-15 ENCOUNTER — Emergency Department: Payer: Medicare Other

## 2017-11-15 ENCOUNTER — Encounter: Payer: Self-pay | Admitting: *Deleted

## 2017-11-15 ENCOUNTER — Other Ambulatory Visit: Payer: Self-pay

## 2017-11-15 ENCOUNTER — Emergency Department
Admission: EM | Admit: 2017-11-15 | Discharge: 2017-11-15 | Disposition: A | Discharge status: Home / Self Care | Payer: Medicare Other | Attending: Emergency Medicine | Admitting: Emergency Medicine

## 2017-11-15 DIAGNOSIS — Z87891 Personal history of nicotine dependence: Secondary | ICD-10-CM | POA: Insufficient documentation

## 2017-11-15 DIAGNOSIS — E119 Type 2 diabetes mellitus without complications: Secondary | ICD-10-CM | POA: Insufficient documentation

## 2017-11-15 DIAGNOSIS — R51 Headache: Secondary | ICD-10-CM | POA: Insufficient documentation

## 2017-11-15 DIAGNOSIS — R519 Headache, unspecified: Secondary | ICD-10-CM

## 2017-11-15 DIAGNOSIS — M25511 Pain in right shoulder: Secondary | ICD-10-CM | POA: Diagnosis not present

## 2017-11-15 DIAGNOSIS — J449 Chronic obstructive pulmonary disease, unspecified: Secondary | ICD-10-CM | POA: Insufficient documentation

## 2017-11-15 DIAGNOSIS — Z7901 Long term (current) use of anticoagulants: Secondary | ICD-10-CM | POA: Insufficient documentation

## 2017-11-15 DIAGNOSIS — I10 Essential (primary) hypertension: Secondary | ICD-10-CM | POA: Insufficient documentation

## 2017-11-15 DIAGNOSIS — Z79899 Other long term (current) drug therapy: Secondary | ICD-10-CM | POA: Insufficient documentation

## 2017-11-15 DIAGNOSIS — Z7984 Long term (current) use of oral hypoglycemic drugs: Secondary | ICD-10-CM | POA: Insufficient documentation

## 2017-11-15 LAB — CBC
HEMATOCRIT: 37.3 % (ref 35.0–47.0)
HEMOGLOBIN: 12.7 g/dL (ref 12.0–16.0)
MCH: 31.2 pg (ref 26.0–34.0)
MCHC: 33.9 g/dL (ref 32.0–36.0)
MCV: 92.1 fL (ref 80.0–100.0)
Platelets: 265 10*3/uL (ref 150–440)
RBC: 4.05 MIL/uL (ref 3.80–5.20)
RDW: 14.8 % — AB (ref 11.5–14.5)
WBC: 8.5 10*3/uL (ref 3.6–11.0)

## 2017-11-15 LAB — BASIC METABOLIC PANEL
Anion gap: 9 (ref 5–15)
BUN: 19 mg/dL (ref 8–23)
CO2: 27 mmol/L (ref 22–32)
Calcium: 8.9 mg/dL (ref 8.9–10.3)
Chloride: 102 mmol/L (ref 98–111)
Creatinine, Ser: 0.61 mg/dL (ref 0.44–1.00)
GFR calc Af Amer: >60 mL/min (ref >60)
GFR calc non Af Amer: >60 mL/min (ref >60)
Glucose, Bld: 142 mg/dL — ABNORMAL HIGH (ref 70–99)
POTASSIUM: 3.8 mmol/L (ref 3.5–5.1)
Sodium: 138 mmol/L (ref 135–145)

## 2017-11-15 LAB — TROPONIN I: Troponin I: <0.03 ng/mL (ref <0.03)

## 2017-11-15 NOTE — ED Notes (Signed)
Pt handed her cell phone to this RN to speak with her Dr. Man on phone states he is Dyane DustmanBob Carter a retired MD that states he is very upset with the care she has received and wanted an explanation. He was demanding and stated he wanted to know why no one is watching pt or taking care of her. I explained that her care had begun and we were getting her back to a room as soon as possible and that we have a nurse, advocate and officer in the lobby with our patients at all times.

## 2017-11-15 NOTE — ED Notes (Signed)
Spoke with pt about wait times and what to expect next. Advised pt that I am available for further questions if needed.  

## 2017-11-15 NOTE — ED Notes (Signed)
Attempted to call Dr. Montez Moritaarter back, per pt and Dr. Celene Skeenarter's request -- unsuccessful at reaching him at this time. Pt states "it's okay -- I need to call him back anyway. I will tell him everything is okay."

## 2017-11-15 NOTE — ED Provider Notes (Signed)
[facility] Emergency Department Provider Note  Time seen: 4:54 PM  I have reviewed the triage vital signs and the nursing notes.   HISTORY  Chief Complaint Headache    HPI Denise Cervantes is a 84 y.o. female with a past medical history of COPD, diabetes, hypertension, hyperlipidemia, fibromyalgia, presents to the emergency department for headache.  According to the patient for the past 2 months she has been experiencing a headache.  States is mostly on the right side but occasionally including today it will be left-sided.  Patient has seen her doctor for the same, was referred to a neurologist 11/03/2017 for the same.  States the pain was worse today so she came to the emergency department for evaluation.  Describes the pain is mostly in the left neck radiating into the left head, states it has decreased considerably from earlier today.  Patient also states some pain in her right shoulder at times but this has been an ongoing issue for many months per patient.  Denies any chest pain or shortness of breath.  Largely negative review of systems.  No weakness numbness confusion or slurred speech.   Past Medical History:  Diagnosis Date  . A-fib (HCC)   . Bronchitis, obstructive, chronic (HCC)   . COPD exacerbation (HCC)   . Depression   . Diabetes (HCC)    type II  . Esophageal reflux disease   . Fibromyalgia   . Glaucoma   . HBP (high blood pressure)   . Hypercholesterolemia   . Hypertension   . Memory loss     Patient Active Problem List   Diagnosis Date Noted  . Pneumonia 09/02/2017    History reviewed. No pertinent surgical history.  Prior to Admission medications   Medication Sig Start Date End Date Taking? Authorizing Provider  acetaminophen (TYLENOL) 500 MG tablet Take 1,000 mg by mouth every 8 (eight) hours as needed for mild pain.     [provider]  albuterol (PROVENTIL HFA;VENTOLIN HFA) 108 (90 Base) MCG/ACT inhaler Inhale 2 puffs  into the lungs every 6 (six) hours as needed for wheezing or shortness of breath. 09/05/17   Alford Highland, MD  amiodarone (PACERONE) 200 MG tablet One tablet twice a day for seven days then one tablet daily 09/05/17   Alford Highland, MD  amLODipine (NORVASC) 5 MG tablet Take 5 mg by mouth daily.    [provider]  apixaban (ELIQUIS) 5 MG TABS tablet Take 5 mg by mouth every 12 (twelve) hours.     [provider]  Biotin 5 MG CAPS Take 1 capsule by mouth daily.    [provider]  brimonidine-timolol (COMBIGAN) 0.2-0.5 % ophthalmic solution Place 1 drop into the right eye 2 (two) times daily.    [provider]  Calcium Carbonate Antacid 400 MG CHEW Chew 400 mg by mouth daily as needed (stomach acid).     [provider]  Cholecalciferol 1000 units capsule Take 1,000 Units by mouth daily.     [provider]  doxycycline (VIBRA-TABS) 100 MG tablet Take 1 tablet (100 mg total) by mouth every 12 (twelve) hours. 09/05/17   Alford Highland, MD  escitalopram (LEXAPRO) 5 MG tablet Take 1 tablet by mouth daily. 08/26/17   [provider]  latanoprost (XALATAN) 0.005 % ophthalmic solution Place 1 drop into both eyes at bedtime.    [provider]  losartan (COZAAR) 100 MG tablet Take 100 mg by mouth daily.  [provider]  metFORMIN (GLUCOPHAGE) 500 MG tablet Take 500 mg by mouth 2 (two) times daily with a meal.    [provider]  mometasone-formoterol (DULERA) 100-5 MCG/ACT AERO Inhale 2 puffs into the lungs 2 (two) times daily. 09/05/17   Alford Highland, MD  predniSONE (DELTASONE) 10 MG tablet 4 tablets daily for three days 09/05/17   Alford Highland, MD  rosuvastatin (CRESTOR) 5 MG tablet Take 5 mg by mouth daily. 08/25/17   [provider]  tiotropium (SPIRIVA HANDIHALER) 18 MCG inhalation capsule Place 1 capsule (18 mcg total) into inhaler and inhale daily. 09/05/17 09/05/18  Alford Highland, MD   traZODone (DESYREL) 50 MG tablet Take 50 mg by mouth at bedtime as needed for sleep.     [provider]    Allergies  Allergen Reactions  . Penicillins     History reviewed. No pertinent family history.  Social History Social History   Tobacco Use  . Smoking status: Former Smoker    Types: Cigarettes  . Smokeless tobacco: Never Used  Substance Use Topics  . Alcohol use: No  . Drug use: No    Review of Systems Constitutional: Negative for fever. Eyes: Negative for visual complaints ENT: Negative for recent illness/congestion Cardiovascular: Negative for chest pain. Respiratory: Negative for shortness of breath. Gastrointestinal: Negative for abdominal pain, vomiting  Genitourinary: Negative for urinary compaints Musculoskeletal: Negative for musculoskeletal complaints Skin: Negative for skin complaints  Neurological: Positive for headache All other ROS negative  ____________________________________________   PHYSICAL EXAM:  VITAL SIGNS: ED Triage Vitals  Enc Vitals Group     BP 11/15/17 1426 (!) 149/98     Pulse Rate 11/15/17 1426 77     Resp 11/15/17 1426 16     Temp 11/15/17 1426 98.3 F (36.8 C)     Temp Source 11/15/17 1426 Oral     SpO2 11/15/17 1426 96 %     Weight 11/15/17 1431 132 lb (59.9 kg)     Height --      Head Circumference --      Peak Flow --      Pain Score 11/15/17 1430 8     Pain Loc --      Pain Edu? --      Excl. in [place]? --    Constitutional: Alert and oriented. Well appearing and in no distress. Eyes: Normal exam ENT   Head: Normocephalic and atraumatic.   Mouth/Throat: Mucous membranes are moist. Cardiovascular: Normal rate, regular rhythm.  Respiratory: Normal respiratory effort without tachypnea nor retractions. Breath sounds are clear Gastrointestinal: Soft and nontender. No distention.  Musculoskeletal: Nontender with normal range of motion in all extremities.  Neurologic:  Normal speech and language. No  gross focal neurologic deficits Skin:  Skin is warm, dry and intact.  Psychiatric: Mood and affect are normal.   ____________________________________________     RADIOLOGY  CT scan negative for acute abnormality.  ____________________________________________   INITIAL IMPRESSION / ASSESSMENT AND PLAN / ED COURSE  Pertinent labs & imaging results that were available during my care of the patient were reviewed by me and considered in my medical decision making (see chart for details).  Patient presents to the emergency department for an ongoing headache over the past 2 months, somewhat intermittent and occasionally left-sided but mostly right-sided.  Differential is quite broad but would include tension headaches, radicular pain, neuralgia.  Patient has been seen by her doctor for the same started on gabapentin.  Patient was  referred to neurology who wanted to perform injections in her neck but the patient refused at that time.  Patient's work-up today is reassuring including normal labs, negative troponin, reassuring CT scan.  Overall patient appears very well, ambulating without any difficulty.  States that headache is Artie decreased significantly from earlier today.  We will discharge the patient home with neurology follow-up.  Patient agreeable to this plan of care.  ____________________________________________   FINAL CLINICAL IMPRESSION(S) / ED DIAGNOSES  Headache    Minna AntisPaduchowski, Tory Mckissack, MD 11/15/17 [phone]

## 2017-11-15 NOTE — ED Notes (Signed)
Pt ambulatory upon discharge; declined wheel chair. Verbalized understanding of discharge instructions and follow-up care. VSS. Skin warm and dry. A&O x4.  

## 2017-11-15 NOTE — ED Triage Notes (Signed)
Pt to ED reporting right sided head pain for the past two weeks that changes to left sided head pain, neck pain and shoulder pain. Pt denies weakness but reports feeling off balance for the past couple months as well. Pt has been monitored by PCP but denies having been to the ED for these symptoms. No neuro deficits noted in triage. Pt is alert and oriented x 4. No chest pain, dizziness or SOB.

## 2017-11-15 NOTE — ED Notes (Signed)
Pt up to bathroom across the hall. Assunta GamblesLaura C, RN assisted pt.

## 2017-11-16 ENCOUNTER — Other Ambulatory Visit: Payer: Self-pay | Admitting: Nurse Practitioner

## 2017-11-16 DIAGNOSIS — M542 Cervicalgia: Secondary | ICD-10-CM

## 2017-11-21 ENCOUNTER — Ambulatory Visit
Admission: RE | Admit: 2017-11-21 | Discharge: 2017-11-21 | Disposition: A | Discharge status: Home / Self Care | Payer: Medicare Other | Source: Ambulatory Visit | Attending: Nurse Practitioner | Admitting: Nurse Practitioner

## 2019-03-24 ENCOUNTER — Inpatient Hospital Stay
Admission: EM | Admit: 2019-03-24 | Discharge: 2019-03-28 | DRG: 522 | Disposition: A | Discharge status: Home / Self Care | Payer: Medicare Other | Attending: Internal Medicine | Admitting: Internal Medicine

## 2019-03-24 ENCOUNTER — Other Ambulatory Visit: Payer: Self-pay

## 2019-03-24 ENCOUNTER — Emergency Department: Payer: Medicare Other

## 2019-03-24 ENCOUNTER — Encounter: Payer: Self-pay | Admitting: Emergency Medicine

## 2019-03-24 DIAGNOSIS — F329 Major depressive disorder, single episode, unspecified: Secondary | ICD-10-CM | POA: Diagnosis present

## 2019-03-24 DIAGNOSIS — I4891 Unspecified atrial fibrillation: Secondary | ICD-10-CM | POA: Diagnosis not present

## 2019-03-24 DIAGNOSIS — Z7984 Long term (current) use of oral hypoglycemic drugs: Secondary | ICD-10-CM | POA: Diagnosis not present

## 2019-03-24 DIAGNOSIS — J449 Chronic obstructive pulmonary disease, unspecified: Secondary | ICD-10-CM

## 2019-03-24 DIAGNOSIS — S72002A Fracture of unspecified part of neck of left femur, initial encounter for closed fracture: Secondary | ICD-10-CM | POA: Diagnosis present

## 2019-03-24 DIAGNOSIS — F0390 Unspecified dementia without behavioral disturbance: Secondary | ICD-10-CM | POA: Diagnosis present

## 2019-03-24 DIAGNOSIS — F05 Delirium due to known physiological condition: Secondary | ICD-10-CM | POA: Diagnosis not present

## 2019-03-24 DIAGNOSIS — Z7901 Long term (current) use of anticoagulants: Secondary | ICD-10-CM

## 2019-03-24 DIAGNOSIS — Z8781 Personal history of (healed) traumatic fracture: Secondary | ICD-10-CM

## 2019-03-24 DIAGNOSIS — Z66 Do not resuscitate: Secondary | ICD-10-CM | POA: Diagnosis present

## 2019-03-24 DIAGNOSIS — I482 Chronic atrial fibrillation, unspecified: Secondary | ICD-10-CM | POA: Diagnosis not present

## 2019-03-24 DIAGNOSIS — K219 Gastro-esophageal reflux disease without esophagitis: Secondary | ICD-10-CM | POA: Diagnosis present

## 2019-03-24 DIAGNOSIS — I10 Essential (primary) hypertension: Secondary | ICD-10-CM | POA: Diagnosis present

## 2019-03-24 DIAGNOSIS — I252 Old myocardial infarction: Secondary | ICD-10-CM | POA: Diagnosis not present

## 2019-03-24 DIAGNOSIS — E7800 Pure hypercholesterolemia, unspecified: Secondary | ICD-10-CM | POA: Diagnosis present

## 2019-03-24 DIAGNOSIS — I160 Hypertensive urgency: Secondary | ICD-10-CM | POA: Diagnosis present

## 2019-03-24 DIAGNOSIS — K5900 Constipation, unspecified: Secondary | ICD-10-CM | POA: Diagnosis not present

## 2019-03-24 DIAGNOSIS — D500 Iron deficiency anemia secondary to blood loss (chronic): Secondary | ICD-10-CM | POA: Diagnosis not present

## 2019-03-24 DIAGNOSIS — Z880 Allergy status to penicillin: Secondary | ICD-10-CM

## 2019-03-24 DIAGNOSIS — Z20828 Contact with and (suspected) exposure to other viral communicable diseases: Secondary | ICD-10-CM | POA: Diagnosis present

## 2019-03-24 DIAGNOSIS — R410 Disorientation, unspecified: Secondary | ICD-10-CM | POA: Diagnosis not present

## 2019-03-24 DIAGNOSIS — S72001A Fracture of unspecified part of neck of right femur, initial encounter for closed fracture: Secondary | ICD-10-CM

## 2019-03-24 DIAGNOSIS — Z87891 Personal history of nicotine dependence: Secondary | ICD-10-CM | POA: Diagnosis not present

## 2019-03-24 DIAGNOSIS — Z79899 Other long term (current) drug therapy: Secondary | ICD-10-CM

## 2019-03-24 DIAGNOSIS — M797 Fibromyalgia: Secondary | ICD-10-CM | POA: Diagnosis present

## 2019-03-24 DIAGNOSIS — Z8673 Personal history of transient ischemic attack (TIA), and cerebral infarction without residual deficits: Secondary | ICD-10-CM | POA: Diagnosis not present

## 2019-03-24 DIAGNOSIS — Y92009 Unspecified place in unspecified non-institutional (private) residence as the place of occurrence of the external cause: Secondary | ICD-10-CM | POA: Diagnosis not present

## 2019-03-24 DIAGNOSIS — S72011A Unspecified intracapsular fracture of right femur, initial encounter for closed fracture: Principal | ICD-10-CM | POA: Diagnosis present

## 2019-03-24 DIAGNOSIS — E119 Type 2 diabetes mellitus without complications: Secondary | ICD-10-CM | POA: Diagnosis present

## 2019-03-24 DIAGNOSIS — I480 Paroxysmal atrial fibrillation: Secondary | ICD-10-CM | POA: Diagnosis present

## 2019-03-24 DIAGNOSIS — H409 Unspecified glaucoma: Secondary | ICD-10-CM | POA: Diagnosis present

## 2019-03-24 DIAGNOSIS — W01198A Fall on same level from slipping, tripping and stumbling with subsequent striking against other object, initial encounter: Secondary | ICD-10-CM | POA: Diagnosis present

## 2019-03-24 LAB — BASIC METABOLIC PANEL
Anion gap: 8 (ref 5–15)
BUN: 17 mg/dL (ref 8–23)
CO2: 26 mmol/L (ref 22–32)
Calcium: 8.6 mg/dL — ABNORMAL LOW (ref 8.9–10.3)
Chloride: 104 mmol/L (ref 98–111)
Creatinine, Ser: 0.75 mg/dL (ref 0.44–1.00)
GFR calc Af Amer: >60 mL/min (ref >60)
GFR calc non Af Amer: >60 mL/min (ref >60)
Glucose, Bld: 145 mg/dL — ABNORMAL HIGH (ref 70–99)
Potassium: 3.6 mmol/L (ref 3.5–5.1)
Sodium: 138 mmol/L (ref 135–145)

## 2019-03-24 LAB — PROTIME-INR
INR: 1.1 (ref 0.8–1.2)
Prothrombin Time: 14 seconds (ref 11.4–15.2)

## 2019-03-24 LAB — CBC WITH DIFFERENTIAL/PLATELET
Abs Immature Granulocytes: 0.06 10*3/uL (ref 0.00–0.07)
Basophils Absolute: 0 10*3/uL (ref 0.0–0.1)
Basophils Relative: 0 %
Eosinophils Absolute: 0.2 10*3/uL (ref 0.0–0.5)
Eosinophils Relative: 2 %
HCT: 38.8 % (ref 36.0–46.0)
Hemoglobin: 12.6 g/dL (ref 12.0–15.0)
Immature Granulocytes: 1 %
Lymphocytes Relative: 19 %
Lymphs Abs: 1.7 10*3/uL (ref 0.7–4.0)
MCH: 30.2 pg (ref 26.0–34.0)
MCHC: 32.5 g/dL (ref 30.0–36.0)
MCV: 93 fL (ref 80.0–100.0)
Monocytes Absolute: 0.7 10*3/uL (ref 0.1–1.0)
Monocytes Relative: 8 %
Neutro Abs: 6.5 10*3/uL (ref 1.7–7.7)
Neutrophils Relative %: 70 %
Platelets: 218 10*3/uL (ref 150–400)
RBC: 4.17 MIL/uL (ref 3.87–5.11)
RDW: 15.1 % (ref 11.5–15.5)
WBC: 9.3 10*3/uL (ref 4.0–10.5)
nRBC: 0 % (ref 0.0–0.2)

## 2019-03-24 LAB — TYPE AND SCREEN
ABO/RH(D): O POS
Antibody Screen: NEGATIVE

## 2019-03-24 MED ORDER — INSULIN ASPART 100 UNIT/ML ~~LOC~~ SOLN
0.0000 [IU] | Freq: Three times a day (TID) | SUBCUTANEOUS | Status: DC
  Administered 2019-03-25: 5 [IU] via SUBCUTANEOUS
  Administered 2019-03-26: 2 [IU] via SUBCUTANEOUS
  Administered 2019-03-26: 7 [IU] via SUBCUTANEOUS
  Administered 2019-03-26 – 2019-03-27 (×2): 2 [IU] via SUBCUTANEOUS
  Administered 2019-03-27: 3 [IU] via SUBCUTANEOUS
  Administered 2019-03-27 – 2019-03-28 (×2): 2 [IU] via SUBCUTANEOUS
  Filled 2019-03-24 (×9): qty 1

## 2019-03-24 MED ORDER — ACETAMINOPHEN 650 MG RE SUPP: 650.0000 mg | Freq: Four times a day (QID) | RECTAL | Status: DC | PRN

## 2019-03-24 MED ORDER — ACETAMINOPHEN 325 MG PO TABS
650.0000 mg | ORAL_TABLET | Freq: Four times a day (QID) | ORAL | Status: DC | PRN
  Administered 2019-03-26 – 2019-03-28 (×4): 650 mg via ORAL
  Filled 2019-03-24 (×4): qty 2

## 2019-03-24 MED ORDER — ONDANSETRON HCL 4 MG PO TABS: 4.0000 mg | ORAL_TABLET | Freq: Four times a day (QID) | ORAL | Status: DC | PRN

## 2019-03-24 MED ORDER — ROSUVASTATIN CALCIUM 5 MG PO TABS
5.0000 mg | ORAL_TABLET | Freq: Every day | ORAL | Status: DC
  Administered 2019-03-26 – 2019-03-28 (×3): 5 mg via ORAL
  Filled 2019-03-24 (×3): qty 1

## 2019-03-24 MED ORDER — BRIMONIDINE TARTRATE-TIMOLOL 0.2-0.5 % OP SOLN
1.0000 [drp] | Freq: Two times a day (BID) | OPHTHALMIC | Status: DC
  Filled 2019-03-24: qty 5

## 2019-03-24 MED ORDER — CALCIUM CARBONATE ANTACID 500 MG PO CHEW: 1.0000 | CHEWABLE_TABLET | Freq: Every day | ORAL | Status: DC | PRN

## 2019-03-24 MED ORDER — LATANOPROST 0.005 % OP SOLN
1.0000 [drp] | Freq: Every day | OPHTHALMIC | Status: DC
  Administered 2019-03-26 – 2019-03-27 (×2): 1 [drp] via OPHTHALMIC
  Filled 2019-03-24 (×2): qty 2.5

## 2019-03-24 MED ORDER — MAGNESIUM HYDROXIDE 400 MG/5ML PO SUSP
30.0000 mL | Freq: Every day | ORAL | Status: DC | PRN
  Administered 2019-03-27: 30 mL via ORAL
  Filled 2019-03-24: qty 30

## 2019-03-24 MED ORDER — BIOTIN 5 MG PO CAPS: 1.0000 | ORAL_CAPSULE | Freq: Every day | ORAL | Status: DC

## 2019-03-24 MED ORDER — HYDROMORPHONE HCL 1 MG/ML IJ SOLN
0.5000 mg | Freq: Once | INTRAMUSCULAR | Status: AC
  Administered 2019-03-24: 0.5 mg via INTRAVENOUS
  Filled 2019-03-24: qty 1

## 2019-03-24 MED ORDER — ONDANSETRON HCL 4 MG/2ML IJ SOLN
4.0000 mg | Freq: Four times a day (QID) | INTRAMUSCULAR | Status: DC | PRN
  Administered 2019-03-25: 4 mg via INTRAVENOUS

## 2019-03-24 MED ORDER — MORPHINE SULFATE (PF) 4 MG/ML IV SOLN
4.0000 mg | Freq: Once | INTRAVENOUS | Status: AC
  Administered 2019-03-24: 4 mg via INTRAVENOUS
  Filled 2019-03-24: qty 1

## 2019-03-24 MED ORDER — SODIUM CHLORIDE 0.9 % IV SOLN
INTRAVENOUS | Status: DC
  Administered 2019-03-25: via INTRAVENOUS

## 2019-03-24 MED ORDER — OXYCODONE HCL 5 MG PO TABS
5.0000 mg | ORAL_TABLET | ORAL | Status: DC | PRN
  Administered 2019-03-26: 5 mg via ORAL
  Filled 2019-03-24: qty 1

## 2019-03-24 MED ORDER — TIOTROPIUM BROMIDE MONOHYDRATE 18 MCG IN CAPS
18.0000 ug | ORAL_CAPSULE | Freq: Every day | RESPIRATORY_TRACT | Status: DC
  Administered 2019-03-26 – 2019-03-28 (×3): 18 ug via RESPIRATORY_TRACT
  Filled 2019-03-24: qty 5

## 2019-03-24 MED ORDER — ESCITALOPRAM OXALATE 10 MG PO TABS
5.0000 mg | ORAL_TABLET | Freq: Every day | ORAL | Status: DC
  Administered 2019-03-26 – 2019-03-28 (×3): 5 mg via ORAL
  Filled 2019-03-24 (×4): qty 0.5

## 2019-03-24 MED ORDER — AMLODIPINE BESYLATE 5 MG PO TABS
5.0000 mg | ORAL_TABLET | Freq: Every day | ORAL | Status: DC
  Administered 2019-03-26: 5 mg via ORAL
  Filled 2019-03-24: qty 1

## 2019-03-24 MED ORDER — ALBUTEROL SULFATE HFA 108 (90 BASE) MCG/ACT IN AERS
2.0000 | INHALATION_SPRAY | Freq: Four times a day (QID) | RESPIRATORY_TRACT | Status: DC | PRN
  Filled 2019-03-24: qty 6.7

## 2019-03-24 MED ORDER — TRAZODONE HCL 50 MG PO TABS
50.0000 mg | ORAL_TABLET | Freq: Every evening | ORAL | Status: DC | PRN
  Administered 2019-03-25: 50 mg via ORAL
  Filled 2019-03-24: qty 1

## 2019-03-24 MED ORDER — IPRATROPIUM-ALBUTEROL 0.5-2.5 (3) MG/3ML IN SOLN: 3.0000 mL | Freq: Four times a day (QID) | RESPIRATORY_TRACT | Status: DC

## 2019-03-24 MED ORDER — LOSARTAN POTASSIUM 50 MG PO TABS
100.0000 mg | ORAL_TABLET | Freq: Every day | ORAL | Status: DC
  Administered 2019-03-26: 100 mg via ORAL
  Filled 2019-03-24: qty 2

## 2019-03-24 MED ORDER — AMIODARONE HCL 200 MG PO TABS
200.0000 mg | ORAL_TABLET | Freq: Every day | ORAL | Status: DC
  Administered 2019-03-26 – 2019-03-28 (×3): 200 mg via ORAL
  Filled 2019-03-24 (×4): qty 1

## 2019-03-24 MED ORDER — ONDANSETRON HCL 4 MG/2ML IJ SOLN
4.0000 mg | Freq: Once | INTRAMUSCULAR | Status: AC
  Administered 2019-03-24: 4 mg via INTRAVENOUS
  Filled 2019-03-24: qty 2

## 2019-03-24 MED ORDER — VITAMIN D 25 MCG (1000 UNIT) PO TABS
1000.0000 [IU] | ORAL_TABLET | Freq: Every day | ORAL | Status: DC
  Administered 2019-03-26 – 2019-03-28 (×3): 1000 [IU] via ORAL
  Filled 2019-03-24 (×3): qty 1

## 2019-03-24 MED ORDER — MORPHINE SULFATE (PF) 2 MG/ML IV SOLN: 2.0000 mg | INTRAVENOUS | Status: DC | PRN

## 2019-03-24 NOTE — ED Provider Notes (Signed)
[facility] Emergency Department Provider Note   ____________________________________________   First MD Initiated Contact with Patient 03/24/19 1909     (approximate)  I have reviewed the triage vital signs and the nursing notes.   HISTORY  Chief Complaint Fall    HPI Denise Cervantes is a 85 y.o. female who was working on her husband's Foley and got tangled up in the hose and tripped and fell.  She reports her head hit the wall and she put a dent in the wall.  She has pain in her knee and her hip.  She did not pass out.  She has no other problems currently.  Pain in the hip is very uncomfortable.  Is worse with movement.        Past Medical History:  Diagnosis Date   A-fib (HCC)    Bronchitis, obstructive, chronic (HCC)    COPD exacerbation (HCC)    Depression    Diabetes (HCC)    type II   Esophageal reflux disease    Fibromyalgia    Glaucoma    HBP (high blood pressure)    Hypercholesterolemia    Hypertension    Memory loss     Patient Active Problem List   Diagnosis Date Noted   Closed left hip fracture (HCC) 03/24/2019   Pneumonia 09/02/2017    History reviewed. No pertinent surgical history.  Prior to Admission medications   Medication Sig Start Date End Date Taking? Authorizing Provider  acetaminophen (TYLENOL) 500 MG tablet Take 1,000 mg by mouth every 8 (eight) hours as needed for mild pain.     [provider]  albuterol (PROVENTIL HFA;VENTOLIN HFA) 108 (90 Base) MCG/ACT inhaler Inhale 2 puffs into the lungs every 6 (six) hours as needed for wheezing or shortness of breath. 09/05/17   Alford Highland, MD  amiodarone (PACERONE) 200 MG tablet One tablet twice a day for seven days then one tablet daily 09/05/17   Alford Highland, MD  amLODipine (NORVASC) 5 MG tablet Take 5 mg by mouth daily.    [provider]  apixaban (ELIQUIS) 5 MG TABS tablet Take 5 mg by mouth every 12 (twelve) hours.      [provider]  Biotin 5 MG CAPS Take 1 capsule by mouth daily.    [provider]  brimonidine-timolol (COMBIGAN) 0.2-0.5 % ophthalmic solution Place 1 drop into the right eye 2 (two) times daily.    [provider]  Calcium Carbonate Antacid 400 MG CHEW Chew 400 mg by mouth daily as needed (stomach acid).     [provider]  Cholecalciferol 1000 units capsule Take 1,000 Units by mouth daily.     [provider]  doxycycline (VIBRA-TABS) 100 MG tablet Take 1 tablet (100 mg total) by mouth every 12 (twelve) hours. Patient not taking: Reported on 03/24/2019 09/05/17   Alford Highland, MD  escitalopram (LEXAPRO) 5 MG tablet Take 1 tablet by mouth daily. 08/26/17   [provider]  latanoprost (XALATAN) 0.005 % ophthalmic solution Place 1 drop into both eyes at bedtime.    [provider]  losartan (COZAAR) 100 MG tablet Take 100 mg by mouth daily.    [provider]  metFORMIN (GLUCOPHAGE) 500 MG tablet Take 500 mg by mouth 2 (two) times daily with a meal.    [provider]  mometasone-formoterol (DULERA) 100-5 MCG/ACT AERO Inhale 2 puffs into the lungs 2 (two) times daily. 09/05/17   Alford Highland, MD  rosuvastatin (  CRESTOR) 5 MG tablet Take 5 mg by mouth daily. 08/25/17   [provider]  tiotropium (SPIRIVA HANDIHALER) 18 MCG inhalation capsule Place 1 capsule (18 mcg total) into inhaler and inhale daily. 09/05/17 09/05/18  Loletha Grayer, MD  traZODone (DESYREL) 50 MG tablet Take 50 mg by mouth at bedtime as needed for sleep.     [provider]    Allergies Penicillins  History reviewed. No pertinent family history.  Social History Social History   Tobacco Use   Smoking status: Former Smoker    Types: Cigarettes   Smokeless tobacco: Never Used  Substance Use Topics   Alcohol use: No   Drug use: No    Review of Systems  Constitutional: No fever/chills Eyes: No visual  changes. ENT: No sore throat. Cardiovascular: Denies chest pain. Respiratory: Denies shortness of breath. Gastrointestinal: No abdominal pain.  No nausea, no vomiting.  No diarrhea.  No constipation. Genitourinary: Negative for dysuria. Musculoskeletal: Negative for back pain. Skin: Negative for rash. Neurological: Negative for headaches, focal weakness   ____________________________________________   PHYSICAL EXAM:  VITAL SIGNS: ED Triage Vitals  Enc Vitals Group     BP 03/24/19 1853 (!) 179/99     Pulse Rate 03/24/19 1853 99     Resp 03/24/19 1853 18     Temp 03/24/19 1853 98.5 F (36.9 C)     Temp Source 03/24/19 1853 Oral     SpO2 03/24/19 1853 97 %     Weight 03/24/19 1855 126 lb (57.2 kg)     Height 03/24/19 1855 5\' 2"  (1.575 m)     Head Circumference --      Peak Flow --      Pain Score 03/24/19 1853 10     Pain Loc --      Pain Edu? --      Excl. in [place]? --     Constitutional: Alert and oriented. Well appearing and in no acute distress. Eyes: Conjunctivae are normal. Head: Atraumatic. Nose: No congestion/rhinnorhea. Mouth/Throat: Mucous membranes are moist.  Oropharynx non-erythematous. Neck: No stridor.   Cardiovascular: Normal rate, regular rhythm. Grossly normal heart sounds.  Good peripheral circulation. Respiratory: Normal respiratory effort.  No retractions. Lungs CTAB. Gastrointestinal: Soft and nontender. No distention. No abdominal bruits. No CVA tenderness. Musculoskeletal: Right hip is somewhat tender right knee is also slightly tender there is a bruise medially below the patella on the right knee no obvious bruising on the hip. Neurologic:  Normal speech and language. No gross focal neurologic deficits are appreciated.  Skin:  Skin is warm, dry and intact. No rash noted. Psychiatric: Mood and affect are normal. Speech and behavior are normal.  ____________________________________________   LABS (all labs ordered are listed, but only abnormal  results are displayed)  Labs Reviewed  BASIC METABOLIC PANEL - Abnormal; Notable for the following components:      Result Value   Glucose, Bld 145 (*)    Calcium 8.6 (*)    All other components within normal limits  SARS CORONAVIRUS 2 (TAT 6-24 HRS)  CBC WITH DIFFERENTIAL/PLATELET  PROTIME-INR  BASIC METABOLIC PANEL  CBC  HEMOGLOBIN A1C  TYPE AND SCREEN   ____________________________________________  EKG   ____________________________________________  RADIOLOGY  ED MD interpretation: CT of the head and neck reviewed by radiologist and reviewed by me showed no acute findings.  Knee x-ray shows no acute fracture per radiologist I reviewed the film and the hip x-ray shows a subcapital fracture by radiologist I also  reviewed that film and agree  Official radiology report(s): Ct Head Wo Contrast  Result Date: 03/24/2019 CLINICAL DATA:  Recent fall EXAM: CT HEAD WITHOUT CONTRAST CT CERVICAL SPINE WITHOUT CONTRAST TECHNIQUE: Multidetector CT imaging of the head and cervical spine was performed following the standard protocol without intravenous contrast. Multiplanar CT image reconstructions of the cervical spine were also generated. COMPARISON:  11/15/2017 FINDINGS: CT HEAD FINDINGS Brain: Chronic atrophic and ischemic changes are again identified. No findings to suggest acute hemorrhage, acute infarction or space-occupying mass lesion are noted. Vascular: No hyperdense vessel or unexpected calcification. Skull: Normal. Negative for fracture or focal lesion. Sinuses/Orbits: No acute finding. Other: None. CT CERVICAL SPINE FINDINGS Alignment: Mild loss of the normal cervical lordosis is noted which appears degenerative in nature. Skull base and vertebrae: 7 cervical segments are well visualized. Disc space narrowing at C5-6 and C6-7 is seen with associated osteophytic changes. Posterior fusion defect at C1 is noted. No acute fracture or acute facet abnormality is noted. Multilevel facet  hypertrophic changes are seen. Soft tissues and spinal canal: Surrounding soft tissue structures show vascular calcifications as well as multiple hypodense nodules within the thyroid. These appear similar to that seen on a prior carotid ultrasound. Upper chest: Visualized lung apices are within normal limits. Other: None IMPRESSION: CT of the head: Chronic atrophic and ischemic changes without acute abnormality. CT of the cervical spine: Multilevel degenerative change without acute abnormality. Multiple thyroid nodules are noted stable from a prior carotid ultrasound from 2019. Given the patient's age no further follow-up is recommended. Electronically Signed   By: Alcide CleverMark  Lukens M.D.   On: 03/24/2019 20:33   Ct Cervical Spine Wo Contrast  Result Date: 03/24/2019 CLINICAL DATA:  Recent fall EXAM: CT HEAD WITHOUT CONTRAST CT CERVICAL SPINE WITHOUT CONTRAST TECHNIQUE: Multidetector CT imaging of the head and cervical spine was performed following the standard protocol without intravenous contrast. Multiplanar CT image reconstructions of the cervical spine were also generated. COMPARISON:  11/15/2017 FINDINGS: CT HEAD FINDINGS Brain: Chronic atrophic and ischemic changes are again identified. No findings to suggest acute hemorrhage, acute infarction or space-occupying mass lesion are noted. Vascular: No hyperdense vessel or unexpected calcification. Skull: Normal. Negative for fracture or focal lesion. Sinuses/Orbits: No acute finding. Other: None. CT CERVICAL SPINE FINDINGS Alignment: Mild loss of the normal cervical lordosis is noted which appears degenerative in nature. Skull base and vertebrae: 7 cervical segments are well visualized. Disc space narrowing at C5-6 and C6-7 is seen with associated osteophytic changes. Posterior fusion defect at C1 is noted. No acute fracture or acute facet abnormality is noted. Multilevel facet hypertrophic changes are seen. Soft tissues and spinal canal: Surrounding soft tissue  structures show vascular calcifications as well as multiple hypodense nodules within the thyroid. These appear similar to that seen on a prior carotid ultrasound. Upper chest: Visualized lung apices are within normal limits. Other: None IMPRESSION: CT of the head: Chronic atrophic and ischemic changes without acute abnormality. CT of the cervical spine: Multilevel degenerative change without acute abnormality. Multiple thyroid nodules are noted stable from a prior carotid ultrasound from 2019. Given the patient's age no further follow-up is recommended. Electronically Signed   By: Alcide CleverMark  Lukens M.D.   On: 03/24/2019 20:33   Dg Knee Complete 4 Views Right  Result Date: 03/24/2019 CLINICAL DATA:  Recent fall with right knee pain, initial encounter EXAM: RIGHT KNEE - COMPLETE 4+ VIEW COMPARISON:  None. FINDINGS: Degenerative changes are noted in the medial joint space with  some irregularity of the medial femoral condyle consistent with a osteochondral defect. No joint effusion is seen. No acute fracture or dislocation is noted. IMPRESSION: Chronic degenerative changes without acute abnormality. Electronically Signed   By: Alcide Clever M.D.   On: 03/24/2019 20:09   Dg Hip Unilat W Or Wo Pelvis 2-3 Views Right  Result Date: 03/24/2019 CLINICAL DATA:  Recent fall with hip pain, initial encounter EXAM: DG HIP (WITH OR WITHOUT PELVIS) 2-3V RIGHT COMPARISON:  None. FINDINGS: Pelvic ring is intact. Degenerative changes of lumbar spine are noted. Subcapital femoral neck fracture is noted with impaction at the fracture site. No focal abnormality is noted. IMPRESSION: Right subcapital femoral neck fracture. Electronically Signed   By: Alcide Clever M.D.   On: 03/24/2019 20:10    ____________________________________________   PROCEDURES  Procedure(s) performed (including Critical Care) critical care time 10 minutes.  This includes reviewing some of her old records and discussing her with both the hospitalist and  the orthopedic surgeon  Procedures   ____________________________________________   INITIAL IMPRESSION / ASSESSMENT AND PLAN / ED COURSE  KAYLEY ZEIDERS was evaluated in Emergency Department on 03/24/2019 for the symptoms described in the history of present illness. She was evaluated in the context of the global COVID-19 pandemic, which necessitated consideration that the patient might be at risk for infection with the SARS-CoV-2 virus that causes COVID-19. Institutional protocols and algorithms that pertain to the evaluation of patients at risk for COVID-19 are in a state of rapid change based on information released by regulatory bodies including the CDC and federal and state organizations. These policies and algorithms were followed during the patient's care in the ED.     Patient initially doing well with morphine later needed some more medicine before she went upstairs.      ____________________________________________   FINAL CLINICAL IMPRESSION(S) / ED DIAGNOSES  Final diagnoses:  Closed fracture of right hip, initial encounter [facility])     ED Discharge Orders    None       Note:  This document was prepared using Dragon voice recognition software and may include unintentional dictation errors.    Arnaldo Natal, MD 03/24/19 [phone]

## 2019-03-24 NOTE — Consult Note (Signed)
Patient with displaced right femoral neck fracture, plan a right hip hemiarthroplasty tomorrow morning if cleared by medical team. Full consult note to follow.

## 2019-03-24 NOTE — H&P (Addendum)
[place] at [facility]   PATIENT NAME: Denise Cervantes    MR#:  [id number]  DATE OF BIRTH:  [birth year]  DATE OF ADMISSION:  03/24/2019  PRIMARY CARE PHYSICIAN: Marisue IvanLinthavong, Kanhka, MD   REQUESTING/REFERRING PHYSICIAN: Dorothea GlassmanMalinda, Paul, MD  CHIEF COMPLAINT:   Chief Complaint  Patient presents with  . Fall    HISTORY OF PRESENT ILLNESS:  Denise Cervantes  is a 85 y.o. Caucasian female with a known history of paroxysmal atrial fibrillation, COPD, type 2 diabetes mellitus, fibromyalgia and GERD, who presented to the emergency room with onset of accidental mechanical fall while she was trying to fix her husband's Foley catheter when she turned around and her feet get tangled per her report and lost her balance falling on her right side.  She had subsequent right hip pain.  No presyncope or syncope.  No dizziness or blurred vision.  No chest pain or dyspnea or palpitations.  She had mild headache without head injuries.  Upon presentation to the emergency room, blood pressure was 179/99 with otherwise normal vital signs.  Labs revealed potassium of 3.6 and blood glucose of 145 with unremarkable CBC.  Her COVID-19 test is currently pending Her right knee x-ray showed chronic degenerative changes without acute abnormalities.  Hip x-rays showed right subcapital femoral neck fracture.  EKG showed atrial fibrillation with controlled rate response of 71 with PVCs.  The patient was given 4 mg of IV morphine sulfate twice as well as 4 mg of IV Zofran.  Dr. Odis LusterBowers was notified about the patient is aware.  She will be admitted to surgical bed for further evaluation and management. PAST MEDICAL HISTORY:   Past Medical History:  Diagnosis Date  . A-fib (HCC)   . Bronchitis, obstructive, chronic (HCC)   . COPD exacerbation (HCC)   . Depression   . Diabetes (HCC)    type II  . Esophageal reflux disease   . Fibromyalgia   . Glaucoma   . HBP (high blood pressure)   . Hypercholesterolemia   .  Hypertension   . Memory loss     PAST SURGICAL HISTORY:  History reviewed. No pertinent surgical history.  SOCIAL HISTORY:   Social History   Tobacco Use  . Smoking status: Former Smoker    Types: Cigarettes  . Smokeless tobacco: Never Used  Substance Use Topics  . Alcohol use: No    FAMILY HISTORY:  History reviewed. No pertinent family history.  DRUG ALLERGIES:   Allergies  Allergen Reactions  . Penicillins     REVIEW OF SYSTEMS:   ROS As per history of present illness. All pertinent systems were reviewed above. Constitutional,  HEENT, cardiovascular, respiratory, GI, GU, musculoskeletal, neuro, psychiatric, endocrine,  integumentary and hematologic systems were reviewed and are otherwise  negative/unremarkable except for positive findings mentioned above in the HPI.   MEDICATIONS AT HOME:   Prior to Admission medications   Medication Sig Start Date End Date Taking? Authorizing Provider  acetaminophen (TYLENOL) 500 MG tablet Take 1,000 mg by mouth every 8 (eight) hours as needed for mild pain.     [provider]  albuterol (PROVENTIL HFA;VENTOLIN HFA) 108 (90 Base) MCG/ACT inhaler Inhale 2 puffs into the lungs every 6 (six) hours as needed for wheezing or shortness of breath. 09/05/17   Alford HighlandWieting, Richard, MD  amiodarone (PACERONE) 200 MG tablet One tablet twice a day for seven days then one tablet daily 09/05/17   Alford HighlandWieting, Richard, MD  amLODipine (NORVASC) 5 MG tablet  Take 5 mg by mouth daily.    [provider]  apixaban (ELIQUIS) 5 MG TABS tablet Take 5 mg by mouth every 12 (twelve) hours.     [provider]  Biotin 5 MG CAPS Take 1 capsule by mouth daily.    [provider]  brimonidine-timolol (COMBIGAN) 0.2-0.5 % ophthalmic solution Place 1 drop into the right eye 2 (two) times daily.    [provider]  Calcium Carbonate Antacid 400 MG CHEW Chew 400 mg by mouth daily as needed (stomach acid).     [provider]  Cholecalciferol 1000 units capsule Take 1,000 Units by mouth daily.     [provider]  doxycycline (VIBRA-TABS) 100 MG tablet Take 1 tablet (100 mg total) by mouth every 12 (twelve) hours. Patient not taking: Reported on 03/24/2019 09/05/17   Alford Highland, MD  escitalopram (LEXAPRO) 5 MG tablet Take 1 tablet by mouth daily. 08/26/17   [provider]  latanoprost (XALATAN) 0.005 % ophthalmic solution Place 1 drop into both eyes at bedtime.    [provider]  losartan (COZAAR) 100 MG tablet Take 100 mg by mouth daily.    [provider]  metFORMIN (GLUCOPHAGE) 500 MG tablet Take 500 mg by mouth 2 (two) times daily with a meal.    [provider]  mometasone-formoterol (DULERA) 100-5 MCG/ACT AERO Inhale 2 puffs into the lungs 2 (two) times daily. 09/05/17   Alford Highland, MD  rosuvastatin (CRESTOR) 5 MG tablet Take 5 mg by mouth daily. 08/25/17   [provider]  tiotropium (SPIRIVA HANDIHALER) 18 MCG inhalation capsule Place 1 capsule (18 mcg total) into inhaler and inhale daily. 09/05/17 09/05/18  Alford Highland, MD  traZODone (DESYREL) 50 MG tablet Take 50 mg by mouth at bedtime as needed for sleep.     [provider]      VITAL SIGNS:  Blood pressure (!) 156/91, pulse (!) 54, temperature 98.5 F (36.9 C), temperature source Oral, resp. rate 17, height  (1.575 m), weight 57.2 kg, SpO2 94 %.  PHYSICAL EXAMINATION:  Physical Exam  GENERAL:  85 y.o.-year-old Caucasian female patient lying in the bed with no acute distress.  EYES: Pupils equal, round, reactive to light and accommodation. No scleral icterus. Extraocular muscles intact.  HEENT: Head atraumatic, normocephalic. Oropharynx and nasopharynx clear.  NECK:  Supple, no jugular venous distention. No thyroid enlargement, no tenderness.  LUNGS: Normal breath sounds bilaterally, no wheezing, rales,rhonchi or crepitation. No use of accessory muscles of  respiration.  CARDIOVASCULAR: Irregularly irregular rhythm, S1, S2 normal. No murmurs, rubs, or gallops.  ABDOMEN: Soft, nondistended, nontender. Bowel sounds present. No organomegaly or mass.  EXTREMITIES: No pedal edema, cyanosis, or clubbing.  NEUROLOGIC: Cranial nerves II through XII are intact. Muscle strength 5/5 in all extremities. Sensation intact. Gait not checked.  PSYCHIATRIC: The patient is alert and oriented x 3.  Normal affect and good eye contact. SKIN: No obvious rash, lesion, or ulcer.   LABORATORY PANEL:   CBC Recent Labs  Lab 03/24/19 2028  WBC 9.3  HGB 12.6  HCT 38.8  PLT 218   ------------------------------------------------------------------------------------------------------------------  Chemistries  Recent Labs  Lab 03/24/19 2028  NA 138  K 3.6  CL 104  CO2 26  GLUCOSE 145*  BUN 17  CREATININE 0.75  CALCIUM 8.6*   ------------------------------------------------------------------------------------------------------------------  Cardiac Enzymes No results for input(s): TROPONINI in the last 168 hours. ------------------------------------------------------------------------------------------------------------------  RADIOLOGY:  Ct Head Wo Contrast  Result Date:  03/24/2019 CLINICAL DATA:  Recent fall EXAM: CT HEAD WITHOUT CONTRAST CT CERVICAL SPINE WITHOUT CONTRAST TECHNIQUE: Multidetector CT imaging of the head and cervical spine was performed following the standard protocol without intravenous contrast. Multiplanar CT image reconstructions of the cervical spine were also generated. COMPARISON:  11/15/2017 FINDINGS: CT HEAD FINDINGS Brain: Chronic atrophic and ischemic changes are again identified. No findings to suggest acute hemorrhage, acute infarction or space-occupying mass lesion are noted. Vascular: No hyperdense vessel or unexpected calcification. Skull: Normal. Negative for fracture or focal lesion. Sinuses/Orbits: No acute finding. Other:  None. CT CERVICAL SPINE FINDINGS Alignment: Mild loss of the normal cervical lordosis is noted which appears degenerative in nature. Skull base and vertebrae: 7 cervical segments are well visualized. Disc space narrowing at C5-6 and C6-7 is seen with associated osteophytic changes. Posterior fusion defect at C1 is noted. No acute fracture or acute facet abnormality is noted. Multilevel facet hypertrophic changes are seen. Soft tissues and spinal canal: Surrounding soft tissue structures show vascular calcifications as well as multiple hypodense nodules within the thyroid. These appear similar to that seen on a prior carotid ultrasound. Upper chest: Visualized lung apices are within normal limits. Other: None IMPRESSION: CT of the head: Chronic atrophic and ischemic changes without acute abnormality. CT of the cervical spine: Multilevel degenerative change without acute abnormality. Multiple thyroid nodules are noted stable from a prior carotid ultrasound from 2019. Given the patient's age no further follow-up is recommended. Electronically Signed   By: Inez Catalina M.D.   On: 03/24/2019 20:33   Ct Cervical Spine Wo Contrast  Result Date: 03/24/2019 CLINICAL DATA:  Recent fall EXAM: CT HEAD WITHOUT CONTRAST CT CERVICAL SPINE WITHOUT CONTRAST TECHNIQUE: Multidetector CT imaging of the head and cervical spine was performed following the standard protocol without intravenous contrast. Multiplanar CT image reconstructions of the cervical spine were also generated. COMPARISON:  11/15/2017 FINDINGS: CT HEAD FINDINGS Brain: Chronic atrophic and ischemic changes are again identified. No findings to suggest acute hemorrhage, acute infarction or space-occupying mass lesion are noted. Vascular: No hyperdense vessel or unexpected calcification. Skull: Normal. Negative for fracture or focal lesion. Sinuses/Orbits: No acute finding. Other: None. CT CERVICAL SPINE FINDINGS Alignment: Mild loss of the normal cervical lordosis  is noted which appears degenerative in nature. Skull base and vertebrae: 7 cervical segments are well visualized. Disc space narrowing at C5-6 and C6-7 is seen with associated osteophytic changes. Posterior fusion defect at C1 is noted. No acute fracture or acute facet abnormality is noted. Multilevel facet hypertrophic changes are seen. Soft tissues and spinal canal: Surrounding soft tissue structures show vascular calcifications as well as multiple hypodense nodules within the thyroid. These appear similar to that seen on a prior carotid ultrasound. Upper chest: Visualized lung apices are within normal limits. Other: None IMPRESSION: CT of the head: Chronic atrophic and ischemic changes without acute abnormality. CT of the cervical spine: Multilevel degenerative change without acute abnormality. Multiple thyroid nodules are noted stable from a prior carotid ultrasound from 2019. Given the patient's age no further follow-up is recommended. Electronically Signed   By: Inez Catalina M.D.   On: 03/24/2019 20:33   Dg Knee Complete 4 Views Right  Result Date: 03/24/2019 CLINICAL DATA:  Recent fall with right knee pain, initial encounter EXAM: RIGHT KNEE - COMPLETE 4+ VIEW COMPARISON:  None. FINDINGS: Degenerative changes are noted in the medial joint space with some irregularity of the medial femoral condyle consistent with a osteochondral defect. No joint effusion is  seen. No acute fracture or dislocation is noted. IMPRESSION: Chronic degenerative changes without acute abnormality. Electronically Signed   By: Alcide Clever M.D.   On: 03/24/2019 20:09   Dg Hip Unilat W Or Wo Pelvis 2-3 Views Right  Result Date: 03/24/2019 CLINICAL DATA:  Recent fall with hip pain, initial encounter EXAM: DG HIP (WITH OR WITHOUT PELVIS) 2-3V RIGHT COMPARISON:  None. FINDINGS: Pelvic ring is intact. Degenerative changes of lumbar spine are noted. Subcapital femoral neck fracture is noted with impaction at the fracture site. No  focal abnormality is noted. IMPRESSION: Right subcapital femoral neck fracture. Electronically Signed   By: Alcide Clever M.D.   On: 03/24/2019 20:10      IMPRESSION AND PLAN:   1.  Closed right hip fracture secondary to mechanical fall.  The patient will be admitted to surgical bed.  Pain management will be provided.  Orthopedic consultation will be obtained.  I notified Dr. Odis Luster about the patient.  She has no history of coronary artery disease, CHF, insulin therapy with diabetes mellitus or renal failure.  She has a history of CVA but with thought to be secondary to atrial fibrillation that is currently with controlled ventricular response.  She is considered above average risk for her age for perioperative cardiovascular events.  She has COPD with no current exacerbation with no other active pulmonary issues.  Her Eliquis will be held off.  We will continue the patient's bronchodilator inhaler holding off long-acting beta 1 agonist.  She will be placed on as needed duonebs.  2.  Hypertensive urgency.  She will be placed on as needed IV labetalol and continue her antihypertensives.  3. Atrial fibrillation with controlled response.  We will continue amiodarone and hold off her Eliquis.   4. Type II diabetes mellitus.  She will be placed on supplement coverage with NovoLog and will hold off her metformin.   5.  Depression.  Lexapro will be resumed.  6.  DVT prophylaxis.  SCDs for now and holding off medical prophylaxis pending operative procedure.  All the records are reviewed and case discussed with ED provider. The plan of care was discussed in details with the patient (and family). I answered all questions. The patient agreed to proceed with the above mentioned plan. Further management will depend upon hospital course.   CODE STATUS: Full code  TOTAL TIME TAKING CARE OF THIS PATIENT:  minutes.    Hannah Beat M.D on 03/24/2019 at 9:56 PM  [facility]   From 7 PM-7 AM,  contact night-coverage [email or web address]  CC: Primary care physician; Marisue Ivan, MD   Note: This dictation was prepared with Dragon dictation along with smaller phrase technology. Any transcriptional errors that result from this process are unintentional.

## 2019-03-24 NOTE — ED Notes (Signed)
Admitting MD at bedside.

## 2019-03-24 NOTE — ED Notes (Signed)
Pt assisted onto bed pan to void; cleaned well; chux under patient; pt c/o pain to right hip and right knee; both tender on palpation

## 2019-03-24 NOTE — ED Notes (Signed)
Dr. Malinda at bedside.  

## 2019-03-24 NOTE — ED Notes (Signed)
.. ED TO INPATIENT HANDOFF REPORT  ED Nurse Name and Phone #:  [name], [phone]  S Name/Age/Gender Denise Cervantes 85 y.o. female Room/Bed: ED17A/ED17A  Code Status   Code Status: Full Code  Home/SNF/Other Home Patient oriented to: self, place, time and situation Is this baseline? Yes   Triage Complete: Triage complete  Chief Complaint fall  Triage Note Pt via EMS from home for a fall. Pt states it was a trip and fall. Pt hit the back of her head on the wall but did not lost consciousness. Pt is on Eliquis. Pt c/o R leg pain and hip pain. Notable swelling to the R knee.   75 of Fentanyl given en route.    Allergies Allergies  Allergen Reactions  . Penicillins     Level of Care/Admitting Diagnosis ED Disposition    ED Disposition Condition [facility] Area: [place] [[id number]]  Level of Care: Med-Surg [16]  Covid Evaluation: Asymptomatic Screening Protocol (No Symptoms)  Diagnosis: Closed left hip fracture [facility]) [[id number]]  Admitting Physician: Denise Cervantes [[id number]]  Attending Physician: Denise Cervantes [[id number]]  Estimated length of stay: 3 - 4 days  Certification:: I certify this patient will need inpatient services for at least 2 midnights  PT Class (Do Not Modify): Inpatient [101]  PT Acc Code (Do Not Modify): Private [1]       B Medical/Surgery History Past Medical History:  Diagnosis Date  . A-fib ([place])   . Bronchitis, obstructive, chronic ([place])   . COPD exacerbation ([place])   . Depression   . Diabetes ([place])    type II  . Esophageal reflux disease   . Fibromyalgia   . Glaucoma   . HBP (high blood pressure)   . Hypercholesterolemia   . Hypertension   . Memory loss    History reviewed. No pertinent surgical history.   A IV Location/Drains/Wounds Patient Lines/Drains/Airways Status   Active Line/Drains/Airways    Name:   Placement date:   Placement time:   Site:   Days:   Peripheral IV 03/24/19 Left  Antecubital   03/24/19    -    Antecubital   less than 1          Intake/Output Last 24 hours No intake or output data in the 24 hours ending 03/24/19 2224  Labs/Imaging Results for orders placed or performed during the hospital encounter of 03/24/19 (from the past 48 hour(s))  Basic metabolic panel     Status: Abnormal   Collection Time: 03/24/19  8:28 PM  Result Value Ref Range   Sodium 138 135 - 145 mmol/L   Potassium 3.6 3.5 - 5.1 mmol/L   Chloride 104 98 - 111 mmol/L   CO2 26 22 - 32 mmol/L   Glucose, Bld 145 (H) 70 - 99 mg/dL   BUN 17 8 - 23 mg/dL   Creatinine, Ser 0.75 0.44 - 1.00 mg/dL   Calcium 8.6 (L) 8.9 - 10.3 mg/dL   GFR calc non Af Amer >60 >60 mL/min   GFR calc Af Amer >60 >60 mL/min   Anion gap 8 5 - 15    Comment: Performed at [facility], [address] [place], [place] [address]  CBC WITH DIFFERENTIAL     Status: None   Collection Time: 03/24/19  8:28 PM  Result Value Ref Range   WBC 9.3 4.0 - 10.5 K/uL   RBC 4.17 3.87 - 5.11 MIL/uL   Hemoglobin  12.6 12.0 - 15.0 g/dL   HCT 87.5 64.3 - 32.9 %   MCV 93.0 80.0 - 100.0 fL   MCH 30.2 26.0 - 34.0 pg   MCHC 32.5 30.0 - 36.0 g/dL   RDW 51.8 84.1 - 66.0 %   Platelets 218 150 - 400 K/uL   nRBC 0.0 0.0 - 0.2 %   Neutrophils Relative % 70 %   Neutro Abs 6.5 1.7 - 7.7 K/uL   Lymphocytes Relative 19 %   Lymphs Abs 1.7 0.7 - 4.0 K/uL   Monocytes Relative 8 %   Monocytes Absolute 0.7 0.1 - 1.0 K/uL   Eosinophils Relative 2 %   Eosinophils Absolute 0.2 0.0 - 0.5 K/uL   Basophils Relative 0 %   Basophils Absolute 0.0 0.0 - 0.1 K/uL   Immature Granulocytes 1 %   Abs Immature Granulocytes 0.06 0.00 - 0.07 K/uL    Comment: Performed at [facility], [address] [place], [place] [address]  Protime-INR     Status: None   Collection Time: 03/24/19  8:28 PM  Result Value Ref Range   Prothrombin Time 14.0 11.4 - 15.2 seconds   INR 1.1 0.8 - 1.2    Comment: (NOTE) INR goal varies based on  device and disease states. Performed at [facility], [address] [place], [place] [address]   Type and screen [facility]     Status: None   Collection Time: 03/24/19  8:28 PM  Result Value Ref Range   ABO/RH(D) O POS    Antibody Screen NEG    Sample Expiration      03/27/2019,2359 Performed at [facility], [address] [place], [place] [address]    Ct Head Wo Contrast  Result Date: 03/24/2019 CLINICAL DATA:  Recent fall EXAM: CT HEAD WITHOUT CONTRAST CT CERVICAL SPINE WITHOUT CONTRAST TECHNIQUE: Multidetector CT imaging of the head and cervical spine was performed following the standard protocol without intravenous contrast. Multiplanar CT image reconstructions of the cervical spine were also generated. COMPARISON:  11/15/2017 FINDINGS: CT HEAD FINDINGS Brain: Chronic atrophic and ischemic changes are again identified. No findings to suggest acute hemorrhage, acute infarction or space-occupying mass lesion are noted. Vascular: No hyperdense vessel or unexpected calcification. Skull: Normal. Negative for fracture or focal lesion. Sinuses/Orbits: No acute finding. Other: None. CT CERVICAL SPINE FINDINGS Alignment: Mild loss of the normal cervical lordosis is noted which appears degenerative in nature. Skull base and vertebrae: 7 cervical segments are well visualized. Disc space narrowing at C5-6 and C6-7 is seen with associated osteophytic changes. Posterior fusion defect at C1 is noted. No acute fracture or acute facet abnormality is noted. Multilevel facet hypertrophic changes are seen. Soft tissues and spinal canal: Surrounding soft tissue structures show vascular calcifications as well as multiple hypodense nodules within the thyroid. These appear similar to that seen on a prior carotid ultrasound. Upper chest: Visualized lung apices are within normal limits. Other: None IMPRESSION: CT of the head: Chronic atrophic and ischemic changes without  acute abnormality. CT of the cervical spine: Multilevel degenerative change without acute abnormality. Multiple thyroid nodules are noted stable from a prior carotid ultrasound from 2019. Given the patient's age no further follow-up is recommended. Electronically Signed   By: Alcide Clever M.D.   On: 03/24/2019 20:33   Ct Cervical Spine Wo Contrast  Result Date: 03/24/2019 CLINICAL DATA:  Recent fall EXAM: CT HEAD WITHOUT CONTRAST CT CERVICAL SPINE WITHOUT CONTRAST TECHNIQUE: Multidetector CT imaging  of the head and cervical spine was performed following the standard protocol without intravenous contrast. Multiplanar CT image reconstructions of the cervical spine were also generated. COMPARISON:  11/15/2017 FINDINGS: CT HEAD FINDINGS Brain: Chronic atrophic and ischemic changes are again identified. No findings to suggest acute hemorrhage, acute infarction or space-occupying mass lesion are noted. Vascular: No hyperdense vessel or unexpected calcification. Skull: Normal. Negative for fracture or focal lesion. Sinuses/Orbits: No acute finding. Other: None. CT CERVICAL SPINE FINDINGS Alignment: Mild loss of the normal cervical lordosis is noted which appears degenerative in nature. Skull base and vertebrae: 7 cervical segments are well visualized. Disc space narrowing at C5-6 and C6-7 is seen with associated osteophytic changes. Posterior fusion defect at C1 is noted. No acute fracture or acute facet abnormality is noted. Multilevel facet hypertrophic changes are seen. Soft tissues and spinal canal: Surrounding soft tissue structures show vascular calcifications as well as multiple hypodense nodules within the thyroid. These appear similar to that seen on a prior carotid ultrasound. Upper chest: Visualized lung apices are within normal limits. Other: None IMPRESSION: CT of the head: Chronic atrophic and ischemic changes without acute abnormality. CT of the cervical spine: Multilevel degenerative change without  acute abnormality. Multiple thyroid nodules are noted stable from a prior carotid ultrasound from 2019. Given the patient's age no further follow-up is recommended. Electronically Signed   By: Alcide Clever M.D.   On: 03/24/2019 20:33   Dg Knee Complete 4 Views Right  Result Date: 03/24/2019 CLINICAL DATA:  Recent fall with right knee pain, initial encounter EXAM: RIGHT KNEE - COMPLETE 4+ VIEW COMPARISON:  None. FINDINGS: Degenerative changes are noted in the medial joint space with some irregularity of the medial femoral condyle consistent with a osteochondral defect. No joint effusion is seen. No acute fracture or dislocation is noted. IMPRESSION: Chronic degenerative changes without acute abnormality. Electronically Signed   By: Alcide Clever M.D.   On: 03/24/2019 20:09   Dg Hip Unilat W Or Wo Pelvis 2-3 Views Right  Result Date: 03/24/2019 CLINICAL DATA:  Recent fall with hip pain, initial encounter EXAM: DG HIP (WITH OR WITHOUT PELVIS) 2-3V RIGHT COMPARISON:  None. FINDINGS: Pelvic ring is intact. Degenerative changes of lumbar spine are noted. Subcapital femoral neck fracture is noted with impaction at the fracture site. No focal abnormality is noted. IMPRESSION: Right subcapital femoral neck fracture. Electronically Signed   By: Alcide Clever M.D.   On: 03/24/2019 20:10    Pending Labs Unresulted Labs (From admission, onward)    Start     Ordered   03/25/19 0500  Basic metabolic panel  Tomorrow morning,   STAT     03/24/19 2156   03/25/19 0500  CBC  Tomorrow morning,   STAT     03/24/19 2156   03/24/19 2155  Hemoglobin A1c  Once,   STAT    Comments: To assess prior glycemic control    03/24/19 2156   03/24/19 2140  SARS CORONAVIRUS 2 (TAT 6-24 HRS) Nasopharyngeal Nasopharyngeal Swab  (Symptomatic/High Risk of Exposure/Tier 1 Patients Labs with Precautions)  Once,   STAT    Question Answer Comment  Is this test for diagnosis or screening Diagnosis of ill patient   Symptomatic for  COVID-19 as defined by CDC Yes   Date of Symptom Onset 03/24/2019   Hospitalized for COVID-19 Yes   Admitted to ICU for COVID-19 No   Previously tested for COVID-19 No   Resident in a congregate (group) care setting No  Employed in healthcare setting No   Pregnant No      03/24/19 2139          Vitals/Pain Today's Vitals   03/24/19 2115 03/24/19 2123 03/24/19 2130 03/24/19 2200  BP: (!) 156/91  139/69 (!) 162/81  Pulse: (!) 54  60 (!) 101  Resp: 17  18 19   Temp:      TempSrc:      SpO2: 94%  95% 95%  Weight:      Height:      PainSc:  9       Isolation Precautions Airborne and Contact precautions  Medications Medications  amiodarone (PACERONE) tablet 200 mg (has no administration in time range)  amLODipine (NORVASC) tablet 5 mg (has no administration in time range)  losartan (COZAAR) tablet 100 mg (has no administration in time range)  rosuvastatin (CRESTOR) tablet 5 mg (has no administration in time range)  escitalopram (LEXAPRO) tablet 5 mg (has no administration in time range)  traZODone (DESYREL) tablet 50 mg (has no administration in time range)  Calcium Carbonate Antacid CHEW 400 mg (has no administration in time range)  Biotin CAPS 5 mg (has no administration in time range)  cholecalciferol (VITAMIN D3) tablet 1,000 Units (has no administration in time range)  albuterol (VENTOLIN HFA) 108 (90 Base) MCG/ACT inhaler 2 puff (has no administration in time range)  brimonidine-timolol (COMBIGAN) 0.2-0.5 % ophthalmic solution 1 drop (has no administration in time range)  latanoprost (XALATAN) 0.005 % ophthalmic solution 1 drop (has no administration in time range)  tiotropium (SPIRIVA) inhalation capsule ([facility] use ONLY) 18 mcg (has no administration in time range)  ipratropium-albuterol (DUONEB) 0.5-2.5 (3) MG/3ML nebulizer solution 3 mL (has no administration in time range)  0.9 %  sodium chloride infusion (has no administration in time range)  acetaminophen  (TYLENOL) tablet 650 mg (has no administration in time range)    Or  acetaminophen (TYLENOL) suppository 650 mg (has no administration in time range)  oxyCODONE (Oxy IR/ROXICODONE) immediate release tablet 5 mg (has no administration in time range)  magnesium hydroxide (MILK OF MAGNESIA) suspension 30 mL (has no administration in time range)  ondansetron (ZOFRAN) tablet 4 mg (has no administration in time range)    Or  ondansetron (ZOFRAN) injection 4 mg (has no administration in time range)  morphine 2 MG/ML injection 2 mg (has no administration in time range)  insulin aspart (novoLOG) injection 0-9 Units (has no administration in time range)  morphine 4 MG/ML injection 4 mg (4 mg Intravenous Given 03/24/19 1924)  ondansetron (ZOFRAN) injection 4 mg (4 mg Intravenous Given 03/24/19 1922)  morphine 4 MG/ML injection 4 mg (4 mg Intravenous Given 03/24/19 2126)    Mobility walks Low fall risk   Focused Assessments Musculoskeletal    R Recommendations: See Admitting Provider Note  Report given to:   Additional Notes:  Pt with right femoral neck fracture; head CT and neck CT clear; right knee xray negative; purewick in place; alert and oriented x 3; pt is on Eliquis; Afib on monitor-history of same

## 2019-03-24 NOTE — ED Triage Notes (Signed)
Pt via EMS from home for a fall. Pt states it was a trip and fall. Pt hit the back of her head on the wall but did not lost consciousness. Pt is on Eliquis. Pt c/o R leg pain and hip pain. Notable swelling to the R knee.   75 of Fentanyl given en route.

## 2019-03-24 NOTE — ED Notes (Signed)
purewick placed for pt comfort; using without difficulty; pt appreciative; daughter remains at bedside; blood drawn and sent to lab already

## 2019-03-25 ENCOUNTER — Inpatient Hospital Stay: Payer: Medicare Other

## 2019-03-25 ENCOUNTER — Inpatient Hospital Stay: Payer: Medicare Other | Admitting: Anesthesiology

## 2019-03-25 ENCOUNTER — Encounter
Admission: EM | Disposition: A | Discharge status: Home / Self Care | Payer: Self-pay | Source: Home / Self Care | Attending: Internal Medicine

## 2019-03-25 DIAGNOSIS — I482 Chronic atrial fibrillation, unspecified: Secondary | ICD-10-CM

## 2019-03-25 DIAGNOSIS — I10 Essential (primary) hypertension: Secondary | ICD-10-CM

## 2019-03-25 HISTORY — PX: HIP ARTHROPLASTY: SHX981

## 2019-03-25 LAB — HEMOGLOBIN A1C
Hgb A1c MFr Bld: 7.2 % — ABNORMAL HIGH (ref 4.8–5.6)
Mean Plasma Glucose: 159.94 mg/dL

## 2019-03-25 LAB — BASIC METABOLIC PANEL
Anion gap: 8 (ref 5–15)
BUN: 16 mg/dL (ref 8–23)
CO2: 23 mmol/L (ref 22–32)
Calcium: 8 mg/dL — ABNORMAL LOW (ref 8.9–10.3)
Chloride: 103 mmol/L (ref 98–111)
Creatinine, Ser: 0.63 mg/dL (ref 0.44–1.00)
GFR calc Af Amer: >60 mL/min (ref >60)
GFR calc non Af Amer: >60 mL/min (ref >60)
Glucose, Bld: 188 mg/dL — ABNORMAL HIGH (ref 70–99)
Potassium: 3.5 mmol/L (ref 3.5–5.1)
Sodium: 134 mmol/L — ABNORMAL LOW (ref 135–145)

## 2019-03-25 LAB — GLUCOSE, CAPILLARY
Glucose-Capillary: 159 mg/dL — ABNORMAL HIGH (ref 70–99)
Glucose-Capillary: 163 mg/dL — ABNORMAL HIGH (ref 70–99)
Glucose-Capillary: 188 mg/dL — ABNORMAL HIGH (ref 70–99)
Glucose-Capillary: 292 mg/dL — ABNORMAL HIGH (ref 70–99)
Glucose-Capillary: 337 mg/dL — ABNORMAL HIGH (ref 70–99)
Glucose-Capillary: 269 mg/dL — ABNORMAL HIGH (ref 70–99)

## 2019-03-25 LAB — CBC
HCT: 36.9 % (ref 36.0–46.0)
Hemoglobin: 11.9 g/dL — ABNORMAL LOW (ref 12.0–15.0)
MCH: 30.3 pg (ref 26.0–34.0)
MCHC: 32.2 g/dL (ref 30.0–36.0)
MCV: 93.9 fL (ref 80.0–100.0)
Platelets: 195 10*3/uL (ref 150–400)
RBC: 3.93 MIL/uL (ref 3.87–5.11)
RDW: 15.1 % (ref 11.5–15.5)
WBC: 11.8 10*3/uL — ABNORMAL HIGH (ref 4.0–10.5)
nRBC: 0 % (ref 0.0–0.2)

## 2019-03-25 LAB — SURGICAL PCR SCREEN
MRSA, PCR: NEGATIVE
Staphylococcus aureus: NEGATIVE

## 2019-03-25 LAB — SARS CORONAVIRUS 2 (TAT 6-24 HRS): SARS Coronavirus 2: NEGATIVE

## 2019-03-25 SURGERY — HEMIARTHROPLASTY, HIP, DIRECT ANTERIOR APPROACH, FOR FRACTURE
Anesthesia: General | Site: Hip | Laterality: Right

## 2019-03-25 MED ORDER — FENTANYL CITRATE (PF) 100 MCG/2ML IJ SOLN: 25.0000 ug | INTRAMUSCULAR | Status: DC | PRN

## 2019-03-25 MED ORDER — ENOXAPARIN SODIUM 40 MG/0.4ML ~~LOC~~ SOLN
40.0000 mg | SUBCUTANEOUS | Status: DC
  Administered 2019-03-26 – 2019-03-27 (×2): 40 mg via SUBCUTANEOUS
  Filled 2019-03-25 (×3): qty 0.4

## 2019-03-25 MED ORDER — MAGNESIUM CITRATE PO SOLN
1.0000 | Freq: Once | ORAL | Status: DC | PRN
  Filled 2019-03-25: qty 296

## 2019-03-25 MED ORDER — SODIUM CHLORIDE (PF) 0.9 % IJ SOLN
INTRAMUSCULAR | Status: AC
  Filled 2019-03-25: qty 50

## 2019-03-25 MED ORDER — LACTATED RINGERS IV SOLN
INTRAVENOUS | Status: DC
  Administered 2019-03-25 – 2019-03-26 (×2): via INTRAVENOUS

## 2019-03-25 MED ORDER — BACITRACIN 50000 UNITS IM SOLR
INTRAMUSCULAR | Status: AC
  Filled 2019-03-25: qty 2

## 2019-03-25 MED ORDER — BISACODYL 10 MG RE SUPP
10.0000 mg | Freq: Every day | RECTAL | Status: DC | PRN
  Filled 2019-03-25: qty 1

## 2019-03-25 MED ORDER — SODIUM CHLORIDE 0.9 % IR SOLN
Status: DC | PRN
  Administered 2019-03-25: 500 mL

## 2019-03-25 MED ORDER — CLINDAMYCIN PHOSPHATE 900 MG/50ML IV SOLN
900.0000 mg | INTRAVENOUS | Status: AC
  Administered 2019-03-25: 900 mg via INTRAVENOUS
  Filled 2019-03-25: qty 50

## 2019-03-25 MED ORDER — ALUM & MAG HYDROXIDE-SIMETH 200-200-20 MG/5ML PO SUSP: 30.0000 mL | ORAL | Status: DC | PRN

## 2019-03-25 MED ORDER — LIDOCAINE HCL (PF) 2 % IJ SOLN
INTRAMUSCULAR | Status: AC
  Filled 2019-03-25: qty 10

## 2019-03-25 MED ORDER — TRANEXAMIC ACID 1000 MG/10ML IV SOLN
INTRAVENOUS | Status: AC
  Filled 2019-03-25: qty 10

## 2019-03-25 MED ORDER — PROPOFOL 10 MG/ML IV BOLUS
INTRAVENOUS | Status: AC
  Filled 2019-03-25: qty 20

## 2019-03-25 MED ORDER — ROCURONIUM BROMIDE 50 MG/5ML IV SOLN
INTRAVENOUS | Status: AC
  Filled 2019-03-25: qty 1

## 2019-03-25 MED ORDER — MORPHINE SULFATE (PF) 4 MG/ML IV SOLN: 0.5000 mg | INTRAVENOUS | Status: DC | PRN

## 2019-03-25 MED ORDER — SUGAMMADEX SODIUM 200 MG/2ML IV SOLN
INTRAVENOUS | Status: DC | PRN
  Administered 2019-03-25: 200 mg via INTRAVENOUS

## 2019-03-25 MED ORDER — LIDOCAINE HCL (CARDIAC) PF 100 MG/5ML IV SOSY
PREFILLED_SYRINGE | INTRAVENOUS | Status: DC | PRN
  Administered 2019-03-25: 60 mg via INTRAVENOUS

## 2019-03-25 MED ORDER — OXYCODONE HCL 5 MG/5ML PO SOLN: 5.0000 mg | Freq: Once | ORAL | Status: DC | PRN

## 2019-03-25 MED ORDER — BUPIVACAINE HCL (PF) 0.25 % IJ SOLN
INTRAMUSCULAR | Status: AC
  Filled 2019-03-25: qty 30

## 2019-03-25 MED ORDER — DOCUSATE SODIUM 100 MG PO CAPS
100.0000 mg | ORAL_CAPSULE | Freq: Two times a day (BID) | ORAL | Status: DC
  Administered 2019-03-25 – 2019-03-27 (×4): 100 mg via ORAL
  Filled 2019-03-25 (×5): qty 1

## 2019-03-25 MED ORDER — KETOROLAC TROMETHAMINE 15 MG/ML IJ SOLN
15.0000 mg | Freq: Four times a day (QID) | INTRAMUSCULAR | Status: AC
  Administered 2019-03-25 (×2): 15 mg via INTRAVENOUS
  Filled 2019-03-25 (×4): qty 1

## 2019-03-25 MED ORDER — BUPIVACAINE-EPINEPHRINE (PF) 0.25% -1:200000 IJ SOLN
INTRAMUSCULAR | Status: DC | PRN
  Administered 2019-03-25: 30 mL

## 2019-03-25 MED ORDER — BRIMONIDINE TARTRATE 0.2 % OP SOLN
1.0000 [drp] | Freq: Two times a day (BID) | OPHTHALMIC | Status: DC
  Administered 2019-03-25 – 2019-03-28 (×6): 1 [drp] via OPHTHALMIC
  Filled 2019-03-25 (×2): qty 5

## 2019-03-25 MED ORDER — PHENYLEPHRINE HCL (PRESSORS) 10 MG/ML IV SOLN
INTRAVENOUS | Status: DC | PRN
  Administered 2019-03-25 (×2): 100 ug via INTRAVENOUS

## 2019-03-25 MED ORDER — TIMOLOL MALEATE 0.5 % OP SOLN
1.0000 [drp] | Freq: Two times a day (BID) | OPHTHALMIC | Status: DC
  Administered 2019-03-25 – 2019-03-28 (×6): 1 [drp] via OPHTHALMIC
  Filled 2019-03-25 (×2): qty 5

## 2019-03-25 MED ORDER — HYDROMORPHONE HCL 1 MG/ML IJ SOLN
INTRAMUSCULAR | Status: DC | PRN
  Administered 2019-03-25: .4 mg via INTRAVENOUS

## 2019-03-25 MED ORDER — LACTATED RINGERS IV SOLN
INTRAVENOUS | Status: DC | PRN
  Administered 2019-03-25: 10:00:00 via INTRAVENOUS

## 2019-03-25 MED ORDER — METHOCARBAMOL 500 MG PO TABS
500.0000 mg | ORAL_TABLET | Freq: Four times a day (QID) | ORAL | Status: DC | PRN
  Administered 2019-03-27: 500 mg via ORAL
  Filled 2019-03-25 (×2): qty 1

## 2019-03-25 MED ORDER — SUGAMMADEX SODIUM 200 MG/2ML IV SOLN: INTRAVENOUS | Status: DC | PRN

## 2019-03-25 MED ORDER — CLINDAMYCIN PHOSPHATE 600 MG/50ML IV SOLN
600.0000 mg | Freq: Four times a day (QID) | INTRAVENOUS | Status: AC
  Administered 2019-03-25 (×2): 600 mg via INTRAVENOUS
  Filled 2019-03-25 (×2): qty 50

## 2019-03-25 MED ORDER — EPINEPHRINE PF 1 MG/ML IJ SOLN
INTRAMUSCULAR | Status: AC
  Filled 2019-03-25: qty 1

## 2019-03-25 MED ORDER — HYDROCODONE-ACETAMINOPHEN 5-325 MG PO TABS: 1.0000 | ORAL_TABLET | ORAL | Status: DC | PRN

## 2019-03-25 MED ORDER — MENTHOL 3 MG MT LOZG
1.0000 | LOZENGE | OROMUCOSAL | Status: DC | PRN
  Administered 2019-03-27 – 2019-03-28 (×2): 3 mg via ORAL
  Filled 2019-03-25 (×2): qty 9

## 2019-03-25 MED ORDER — PROPOFOL 10 MG/ML IV BOLUS
INTRAVENOUS | Status: DC | PRN
  Administered 2019-03-25: 60 mg via INTRAVENOUS

## 2019-03-25 MED ORDER — FENTANYL CITRATE (PF) 100 MCG/2ML IJ SOLN
INTRAMUSCULAR | Status: AC
  Filled 2019-03-25: qty 2

## 2019-03-25 MED ORDER — TRANEXAMIC ACID-NACL 1000-0.7 MG/100ML-% IV SOLN
INTRAVENOUS | Status: AC | PRN
  Administered 2019-03-25: 2000 mg via INTRAVENOUS

## 2019-03-25 MED ORDER — ACETAMINOPHEN 10 MG/ML IV SOLN
INTRAVENOUS | Status: AC
  Filled 2019-03-25: qty 100

## 2019-03-25 MED ORDER — METHOCARBAMOL 1000 MG/10ML IJ SOLN
500.0000 mg | Freq: Four times a day (QID) | INTRAVENOUS | Status: DC | PRN
  Filled 2019-03-25: qty 5

## 2019-03-25 MED ORDER — DEXAMETHASONE SODIUM PHOSPHATE 10 MG/ML IJ SOLN
INTRAMUSCULAR | Status: AC
  Filled 2019-03-25: qty 1

## 2019-03-25 MED ORDER — ACETAMINOPHEN 500 MG PO TABS
500.0000 mg | ORAL_TABLET | Freq: Four times a day (QID) | ORAL | Status: AC
  Administered 2019-03-25 (×2): 500 mg via ORAL
  Filled 2019-03-25 (×3): qty 1

## 2019-03-25 MED ORDER — OXYCODONE HCL 5 MG PO TABS: 5.0000 mg | ORAL_TABLET | Freq: Once | ORAL | Status: DC | PRN

## 2019-03-25 MED ORDER — ACETAMINOPHEN 10 MG/ML IV SOLN
INTRAVENOUS | Status: DC | PRN
  Administered 2019-03-25: 1000 mg via INTRAVENOUS

## 2019-03-25 MED ORDER — ROCURONIUM BROMIDE 100 MG/10ML IV SOLN
INTRAVENOUS | Status: DC | PRN
  Administered 2019-03-25: 30 mg via INTRAVENOUS

## 2019-03-25 MED ORDER — ONDANSETRON HCL 4 MG/2ML IJ SOLN
INTRAMUSCULAR | Status: AC
  Filled 2019-03-25: qty 2

## 2019-03-25 MED ORDER — IPRATROPIUM-ALBUTEROL 20-100 MCG/ACT IN AERS
1.0000 | INHALATION_SPRAY | Freq: Four times a day (QID) | RESPIRATORY_TRACT | Status: DC
  Administered 2019-03-25 – 2019-03-28 (×7): 1 via RESPIRATORY_TRACT
  Filled 2019-03-25: qty 4

## 2019-03-25 MED ORDER — PHENYLEPHRINE HCL (PRESSORS) 10 MG/ML IV SOLN
INTRAVENOUS | Status: AC
  Filled 2019-03-25: qty 1

## 2019-03-25 MED ORDER — HYDROMORPHONE HCL 1 MG/ML IJ SOLN
INTRAMUSCULAR | Status: AC
  Filled 2019-03-25: qty 1

## 2019-03-25 MED ORDER — CLINDAMYCIN PHOSPHATE 900 MG/50ML IV SOLN
INTRAVENOUS | Status: AC
  Filled 2019-03-25: qty 50

## 2019-03-25 MED ORDER — TRANEXAMIC ACID 1000 MG/10ML IV SOLN: 2000.0000 mg | Freq: Once | INTRAVENOUS | Status: DC

## 2019-03-25 MED ORDER — PHENOL 1.4 % MT LIQD
1.0000 | OROMUCOSAL | Status: DC | PRN
  Filled 2019-03-25: qty 177

## 2019-03-25 MED ORDER — METOCLOPRAMIDE HCL 5 MG/ML IJ SOLN: 5.0000 mg | Freq: Three times a day (TID) | INTRAMUSCULAR | Status: DC | PRN

## 2019-03-25 MED ORDER — FENTANYL CITRATE (PF) 100 MCG/2ML IJ SOLN
INTRAMUSCULAR | Status: DC | PRN
  Administered 2019-03-25 (×2): 25 ug via INTRAVENOUS
  Administered 2019-03-25: 50 ug via INTRAVENOUS

## 2019-03-25 MED ORDER — DEXAMETHASONE SODIUM PHOSPHATE 10 MG/ML IJ SOLN
INTRAMUSCULAR | Status: DC | PRN
  Administered 2019-03-25: 10 mg via INTRAVENOUS

## 2019-03-25 MED ORDER — METOCLOPRAMIDE HCL 10 MG PO TABS: 5.0000 mg | ORAL_TABLET | Freq: Three times a day (TID) | ORAL | Status: DC | PRN

## 2019-03-25 MED ORDER — SUCCINYLCHOLINE CHLORIDE 20 MG/ML IJ SOLN
INTRAMUSCULAR | Status: AC
  Filled 2019-03-25: qty 1

## 2019-03-25 SURGICAL SUPPLY — 49 items
BLADE SAGITTAL WIDE XTHICK NO (BLADE) ×3 IMPLANT
BNDG COHESIVE 4X5 TAN STRL (GAUZE/BANDAGES/DRESSINGS) ×6 IMPLANT
BNDG COHESIVE 6X5 TAN STRL LF (GAUZE/BANDAGES/DRESSINGS) ×3 IMPLANT
BRUSH SCRUB EZ  4% CHG (MISCELLANEOUS) ×4
BRUSH SCRUB EZ 4% CHG (MISCELLANEOUS) ×2 IMPLANT
CHLORAPREP W/TINT 26 (MISCELLANEOUS) ×3 IMPLANT
COVER WAND RF STERILE (DRAPES) ×3 IMPLANT
DRAPE 3/4 80X56 (DRAPES) ×6 IMPLANT
DRAPE C-ARM 42X72 X-RAY (DRAPES) ×3 IMPLANT
DRAPE STERI IOBAN 125X83 (DRAPES) ×3 IMPLANT
DRAPE SURG 17X11 SM STRL (DRAPES) ×3 IMPLANT
DRAPE U-SHAPE 47X51 STRL (DRAPES) ×3 IMPLANT
DRSG AQUACEL AG ADV 3.5X10 (GAUZE/BANDAGES/DRESSINGS) ×3 IMPLANT
DRSG AQUACEL AG ADV 3.5X14 (GAUZE/BANDAGES/DRESSINGS) ×3 IMPLANT
ELECT BLADE 6.5 EXT (BLADE) ×3 IMPLANT
ELECT REM PT RETURN 9FT ADLT (ELECTROSURGICAL) ×3
ELECTRODE REM PT RTRN 9FT ADLT (ELECTROSURGICAL) ×1 IMPLANT
GAUZE XEROFORM 1X8 LF (GAUZE/BANDAGES/DRESSINGS) ×3 IMPLANT
GLOVE INDICATOR 8.0 STRL GRN (GLOVE) ×3 IMPLANT
GLOVE SURG ORTHO 8.0 STRL STRW (GLOVE) ×3 IMPLANT
GOWN STRL REUS W/ TWL LRG LVL3 (GOWN DISPOSABLE) ×2 IMPLANT
GOWN STRL REUS W/ TWL XL LVL3 (GOWN DISPOSABLE) ×1 IMPLANT
GOWN STRL REUS W/TWL LRG LVL3 (GOWN DISPOSABLE) ×4
GOWN STRL REUS W/TWL XL LVL3 (GOWN DISPOSABLE) ×2
HEAD 28MM 0 (Hips) ×3 IMPLANT
HEAD BIPOLAR 44MM (Hips) ×3 IMPLANT
HOOD PEEL AWAY FLYTE STAYCOOL (MISCELLANEOUS) ×9 IMPLANT
IV NS 1000ML (IV SOLUTION) ×2
IV NS 1000ML BAXH (IV SOLUTION) ×1 IMPLANT
KIT PATIENT CARE HANA TABLE (KITS) ×3 IMPLANT
KIT TURNOVER CYSTO (KITS) ×3 IMPLANT
MAT ABSORB  FLUID 56X50 GRAY (MISCELLANEOUS) ×2
MAT ABSORB FLUID 56X50 GRAY (MISCELLANEOUS) ×1 IMPLANT
NDL SAFETY ECLIPSE 18X1.5 (NEEDLE) ×2 IMPLANT
NEEDLE HYPO 18GX1.5 SHARP (NEEDLE) ×4
NEEDLE HYPO 22GX1.5 SAFETY (NEEDLE) ×3 IMPLANT
NEEDLE SPNL 20GX3.5 QUINCKE YW (NEEDLE) ×3 IMPLANT
PACK HIP PROSTHESIS (MISCELLANEOUS) ×3 IMPLANT
PADDING CAST BLEND 4X4 NS (MISCELLANEOUS) ×6 IMPLANT
PILLOW ABDUCTION MEDIUM (MISCELLANEOUS) ×3 IMPLANT
PULSAVAC PLUS IRRIG FAN TIP (DISPOSABLE) ×3
STAPLER SKIN PROX 35W (STAPLE) ×3 IMPLANT
STEM STD COLLAR SZ2 POLARSTEM (Stem) ×3 IMPLANT
SUT BONE WAX W31G (SUTURE) ×3 IMPLANT
SUT DVC 2 QUILL PDO  T11 36X36 (SUTURE) ×2
SUT DVC 2 QUILL PDO T11 36X36 (SUTURE) ×1 IMPLANT
SUT VIC AB 2-0 CT1 18 (SUTURE) ×3 IMPLANT
SYR 20ML LL LF (SYRINGE) ×3 IMPLANT
TIP FAN IRRIG PULSAVAC PLUS (DISPOSABLE) ×1 IMPLANT

## 2019-03-25 NOTE — Anesthesia Postprocedure Evaluation (Signed)
Anesthesia Post Note  Patient: Denise Cervantes  Procedure(s) Performed: ARTHROPLASTY BIPOLAR HIP (HEMIARTHROPLASTY) (Right Hip)  Patient location during evaluation: PACU Anesthesia Type: General Level of consciousness: awake and alert Pain management: pain level controlled Vital Signs Assessment: post-procedure vital signs reviewed and stable Respiratory status: spontaneous breathing, nonlabored ventilation and respiratory function stable Cardiovascular status: blood pressure returned to baseline and stable Postop Assessment: no apparent nausea or vomiting Anesthetic complications: no     Last Vitals:  Vitals:   03/25/19 1231 03/25/19 1344  BP: 118/73 128/73  Pulse: 87 96  Resp:    Temp: 37.1 C 36.8 C  SpO2: 97% 97%    Last Pain:  Vitals:   03/25/19 1344  TempSrc: Oral  PainSc:                  Durenda Hurt

## 2019-03-25 NOTE — Anesthesia Post-op Follow-up Note (Signed)
Anesthesia QCDR form completed.        

## 2019-03-25 NOTE — Op Note (Signed)
03/25/2019  11:15 AM  PATIENT:  Denise Cervantes   MRN: [id number]  PRE-OPERATIVE DIAGNOSIS:  Displaced Subcapital fracture right hip   POST-OPERATIVE DIAGNOSIS: Same  Procedure: Right Hip Anterior Hip Hemiarthroplasty   Surgeon: Elyn Aquas. Harlow Mares, MD   Anesthesia: Spinal   EBL: 100 mL   Specimens: None   Drains: None   Components used: A size 2 Polarstem [employer], a 44 mm bipolar head    Description of the procedure in detail: After informed consent was obtained and the appropriate extremity marked in the pre-operative holding area, the patient was taken to the operating room and placed in the supine position on the fracture table. All pressure points were well padded and bilateral lower extremities were place in traction spars. The hip was prepped and draped in standard sterile fashion. A spinal anesthetic had been delivered by the anesthesia team. The skin and subcutaneous tissues were injected with a mixture of Marcaine with epinephrine for post-operative pain. A longitudinal incision approximately 10 cm in length was carried out from the anterior superior iliac spine to the greater trochanter. The tensor fascia was divided and blunt dissection was taken down to the level of the joint capsule. The lateral circumflex vessels were cauterized. Deep retractors were placed and a portion of the anterior capsule was excised. Using fluoroscopy the neck cut was planned and carried out with a sagittal saw. The head was passed from the field with use of a corkscrew and hip skid. Deep retractors were placed along the acetabulum and bony and soft tissue debris was removed.   Attention was then turned to the proximal femur. The leg was placed in extension and external rotation. The canal was opened and sequentially broached to a size 2. The trial components were placed and the hip relocated. The components were found to be in good position using fluoroscopy. The hip was dislocated and the trial  components removed. The final components were impacted in to position and the hip relocated. The final components were again check with fluoroscopy and found to be in good position. Hemostasis was achieved with electrocautery. The deep capsule was injected with Marcaine and epinephrine. The wound was irrigated with bacitracin laced normal saline and the tensor fascia closed with #2 Quill suture. The subcutaneous tissues were closed with 2-0 vicryl and staples for the skin. A sterile dressing was applied and an abduction pillow. Patient tolerated the procedure well and there were no apparent complication. Patient was taken to the recovery room in good condition.    Elyn Aquas. Harlow Mares, MD  03/25/2019 11:15 AM

## 2019-03-25 NOTE — Anesthesia Preprocedure Evaluation (Addendum)
Anesthesia Evaluation  Patient identified by MRN, date of birth, ID band Patient awake    Reviewed: Allergy & Precautions, H&P , NPO status , Patient's Chart, lab work & pertinent test results  Airway Mallampati: II  TM Distance: >3 FB     Dental  (+) Teeth Intact   Pulmonary COPD, Not current smoker, former smoker,           Cardiovascular hypertension, + Past MI  + dysrhythmias Atrial Fibrillation   Echo 06/12/18: NORMAL LEFT VENTRICULAR SYSTOLIC FUNCTION  WITH MILD LVH NORMAL RIGHT VENTRICULAR SYSTOLIC FUNCTION NO VALVULAR STENOSIS MODERATE MR, TR MODERATE PHTN TRIVIAL AR SCLERITIC AoV MILD PR EF 50-55%   Neuro/Psych PSYCHIATRIC DISORDERS Depression CVA    GI/Hepatic Neg liver ROS, GERD  ,  Endo/Other  diabetes  Renal/GU      Musculoskeletal  (+) Fibromyalgia -  Abdominal   Peds  Hematology negative hematology ROS (+)   Anesthesia Other Findings Afib on Eliquis  Past Medical History: No date: A-fib ([place]) No date: Bronchitis, obstructive, chronic ([facility]) No date: COPD exacerbation ([facility]) No date: Depression No date: Diabetes ([place])     Comment:  type II No date: Esophageal reflux disease No date: Fibromyalgia No date: Glaucoma No date: HBP (high blood pressure) No date: Hypercholesterolemia No date: Hypertension No date: Memory loss   Reproductive/Obstetrics negative OB ROS                          Anesthesia Physical Anesthesia Plan  ASA: III  Anesthesia Plan: General ETT   Post-op Pain Management:    Induction:   PONV Risk Score and Plan: Ondansetron, Dexamethasone and Treatment may vary due to age or medical condition  Airway Management Planned:   Additional Equipment:   Intra-op Plan:   Post-operative Plan:   Informed Consent: I have reviewed the patients History and Physical, chart, labs and discussed the procedure including the risks, benefits and  alternatives for the proposed anesthesia with the patient or authorized representative who has indicated his/her understanding and acceptance.     Dental Advisory Given  Plan Discussed with: Anesthesiologist, CRNA and Surgeon  Anesthesia Plan Comments: (Spinal contraindicated in setting of anticoagulation.  Will proceed with GETA)      Anesthesia Quick Evaluation

## 2019-03-25 NOTE — Anesthesia Procedure Notes (Signed)
Procedure Name: Intubation Date/Time: 03/25/2019 9:35 AM Performed by: Sherrine Maples, CRNA Pre-anesthesia Checklist: Patient identified, Patient being monitored, Timeout performed, Emergency Drugs available and Suction available Patient Re-evaluated:Patient Re-evaluated prior to induction Oxygen Delivery Method: Circle system utilized Preoxygenation: Pre-oxygenation with 100% oxygen Induction Type: IV induction Ventilation: Mask ventilation without difficulty Laryngoscope Size: Miller and 2 Grade View: Grade II Tube type: Oral Tube size: 7.0 mm Number of attempts: 1 Airway Equipment and Method: Stylet Placement Confirmation: ETT inserted through vocal cords under direct vision,  positive ETCO2 and breath sounds checked- equal and bilateral Secured at: 21 cm Tube secured with: Tape Dental Injury: Teeth and Oropharynx as per pre-operative assessment

## 2019-03-25 NOTE — Evaluation (Signed)
Physical Therapy Evaluation Patient Details Name: Denise Cervantes MRN: [id number] DOB: 24-Jun-1925 Today's Date: 03/25/2019   History of Present Illness  Pt to ED post R hip hemiarthroplasty after fall tripping on husbands foley catheter while she was attempting to change it. 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 Patient endorses multiple falls/trips in the past 6mos. PMH DM2, afib, COPD, fibromyalgia, and GERD.  Clinical Impression  Patient is a 85 year old female s/p R hip hemiarthroplasty ant approach 03/25/19. Patient tolerated session very well, with pain well controlled post surgery, with some confusion but oriented x4. ModA for supine to sit, min/modA for sit to stand, CGA with heavy cuing for controlled stand > sit, minA to ambulate 5825ft with RW with heavy cuing. Impairments in balance, LE and trunk strength, and ROM inhibiting independence in transfers, ambulation, and dynamic balance activities disallowing for IND ADLs and caretaking for her husband at this time. Would benefit from skilled PT to address above deficits and promote optimal return to PLOF.     Follow Up Recommendations SNF    Equipment Recommendations  Rolling walker with 5" wheels    Recommendations for Other Services       Precautions / Restrictions        Mobility  Bed Mobility Overal bed mobility: Needs Assistance Bed Mobility: Supine to Sit     Supine to sit: Mod assist     General bed mobility comments: assistance at RLE to EOB, assistance once on side to sit with poor balance  Transfers Overall transfer level: Needs assistance Equipment used: Rolling walker (2 wheeled) Transfers: Sit to/from Stand Sit to Stand: Mod assist         General transfer comment: mod A to initiate stand, minA from stand to sit. Cuing for hand placement set up to stand with RW, and heavy cuing for feeling  BLEs at chair and reaching back for chair to sit; good compliance  Ambulation/Gait Ambulation/Gait assistance: Min assist Gait Distance (Feet): 25 Feet Assistive device: Rolling walker (2 wheeled) Gait Pattern/deviations: Decreased stride length;Decreased weight shift to right Gait velocity: decreased   General Gait Details: Patient with heavy L wt shift requiring minA to prevent LOB x3, mostly heavy CGA  Stairs            Wheelchair Mobility    Modified Rankin (Stroke Patients Only)       Balance Overall balance assessment: Needs assistance Sitting-balance support: No upper extremity supported;Feet supported Sitting balance-Leahy Scale: Fair Sitting balance - Comments: Can maintain balance momentarily without UE support, cannot tolerate challenge Postural control: Left lateral lean   Standing balance-Leahy Scale: Fair                               Pertinent Vitals/Pain Pain Assessment: No/denies pain    Home Living Family/patient expects to be discharged to:: Private residence([facility]) Living Arrangements: Spouse/significant other Available Help at Discharge: Family;Friend(s) Type of Home: House Home Access: Stairs to enter Entrance Stairs-Rails: Right Entrance Stairs-Number of Steps: 2 Home Layout: One level Home Equipment: Environmental consultantWalker - 2 wheels;Shower seat - built in      Prior Function Level of Independence: Independent with assistive device(s)         Comments: [facility] [place] community support system. Has a RW but reports she did not use it, but furniture  walked     Hand Dominance        Extremity/Trunk Assessment   Upper Extremity Assessment Upper Extremity Assessment: Generalized weakness    Lower Extremity Assessment Lower Extremity Assessment: Generalized weakness    Cervical / Trunk Assessment Cervical / Trunk Assessment: Kyphotic  Communication   Communication: HOH  Cognition Arousal/Alertness:  Awake/alert Behavior During Therapy: WFL for tasks assessed/performed Overall Cognitive Status: Within Functional Limits for tasks assessed                                 General Comments: Repetitive with questions, most likely d/t pain medication      General Comments      Exercises Other Exercises Other Exercises: supine > sit modA with cuing for technique with good carry over but assistance needed with RLE negotiation EOB and heavy assistance needed at trunk from sidelying to EOB with minimal balance. Once sitting EOB patient has difficult time with balance, unable to tolerate challenge without UE support, heavy L lateral lean Other Exercises: Sit > stand modA for initiation with cuing needed for set up and hand placement, good compliance x2 trials Other Exercises: Stand > sit PT educated patient to feel chair against LE and reach back for chair with UE which she is able to do and control sit with CGA x2 trials Other Exercises: Amb with RW with increased time needed d/t decreased speed, confusion, and line management. Patient with heavy L lateral lean, needing minA to prevent LOB x4 occasions. Cuing to maintain LEs inside RW, and to increase step length and foot clearance, decent caarry over.   Assessment/Plan    PT Assessment Patient needs continued PT services  PT Problem List Decreased strength;Decreased mobility;Decreased safety awareness;Decreased coordination;Decreased activity tolerance;Decreased balance;Pain       PT Treatment Interventions DME instruction;Therapeutic activities;Gait training;Therapeutic exercise;Patient/family education;Stair training;Functional mobility training;Neuromuscular re-education;Manual techniques;Balance training    PT Goals (Current goals can be found in the Care Plan section)  Acute Rehab PT Goals Patient Stated Goal: Return home, walk PT Goal Formulation: With patient Time For Goal Achievement: 04/08/19 Potential to Achieve  Goals: Fair    Frequency Min 2X/week   Barriers to discharge        Co-evaluation               AM-PAC PT "6 Clicks" Mobility  Outcome Measure Help needed turning from your back to your side while in a flat bed without using bedrails?: A Lot Help needed moving from lying on your back to sitting on the side of a flat bed without using bedrails?: A Lot Help needed moving to and from a bed to a chair (including a wheelchair)?: A Little Help needed standing up from a chair using your arms (e.g., wheelchair or bedside chair)?: A Lot Help needed to walk in hospital room?: A Little Help needed climbing 3-5 steps with a railing? : A Lot 6 Click Score: 14    End of Session Equipment Utilized During Treatment: Gait belt Activity Tolerance: Patient tolerated treatment well Patient left: in chair;with chair alarm set;with family/visitor present;with call bell/phone within reach;with SCD's reapplied Nurse Communication: Mobility status PT Visit Diagnosis: Repeated falls (R29.6);Difficulty in walking, not elsewhere classified (R26.2);Other abnormalities of gait and mobility (R26.89);Unsteadiness on feet (R26.81);Muscle weakness (generalized) (M62.81)    Time: 0150-0230 PT Time Calculation (min) (ACUTE ONLY): 40 min   Charges:   PT Evaluation $PT Eval Moderate Complexity:  1 Mod PT Treatments $Therapeutic Activity: 38-52 mins       Staci Acosta PT, DPT  Staci Acosta 03/25/2019, 3:06 PM

## 2019-03-25 NOTE — Progress Notes (Signed)
Spoke with OR nurse as Dr. Harlow Mares was on the unit.  He spoke with patient, orders written and consent obtained for surgery.  OR Tech in room for transfer to OR, bottom partial plate removed and placed in denture cup in top bedside drawer.  Daughter called- non-emergent message left.  No signs of distress noted.

## 2019-03-25 NOTE — Progress Notes (Signed)
PROGRESS NOTE  Denise Cervantes DJS:[id number] DOB: [birth year] DOA: 03/24/2019 PCP: Dion Body, MD   LOS: 1 day   Brief Narrative / Interim history: Is a pleasant 85 year old female with history of paroxysmal A. fib, COPD, type 2 diabetes mellitus, fibromyalgia who came to the emergency room after having a fall.  She was at home trying to fix her husband's Foley catheter when she turned around and fell.  This was followed by right hip pain.  No reported syncopal events, chest pain, palpitations.  In the ED she was found to have right subcapital femoral neck fracture, orthopedic surgery was consulted  Subjective / 24h Interval events: She is doing well this morning, afraid to move due to hip pain.  Denies any chest pain, denies any shortness of breath.  Other than somewhat progressively worsening balance over the last few years she reports that she is feeling at her baseline  Assessment & Plan: Principal Problem Closed right hip fracture secondary to mechanical fall -Orthopedic surgery consulted, patient will be taken to the operating room this morning. -Continue to monitor postop, PT eval, monitor blood counts for blood loss anemia  Active Problems COPD -Stable, no wheezing, no evidence of current exacerbation or issues.  Hypertension -Continue IV as needed's until she is able to take p.o.  A. fib -Currently in A. fib, rate controlled with amiodarone.  She is normally on Eliquis which is now on hold perioperatively.  Resume Eliquis per orthopedic surgery  Type 2 diabetes mellitus -Continue sliding scale, hold home Metformin  Depression -Continue Lexapro   Scheduled Meds: . [MAR Hold] amiodarone  200 mg Oral Daily  . [MAR Hold] amLODipine  5 mg Oral Daily  . [MAR Hold] brimonidine  1 drop Right Eye BID   And  . [MAR Hold] timolol  1 drop Right Eye BID  . [MAR Hold] cholecalciferol  1,000 Units Oral Daily  . [MAR Hold] escitalopram  5 mg Oral Daily  . [MAR Hold]  insulin aspart  0-9 Units Subcutaneous TID WC  . [MAR Hold] Ipratropium-Albuterol  1 puff Inhalation QID  . [MAR Hold] latanoprost  1 drop Both Eyes QHS  . [MAR Hold] losartan  100 mg Oral Daily  . [MAR Hold] rosuvastatin  5 mg Oral Daily  . [MAR Hold] tiotropium  18 mcg Inhalation Daily  . [MAR Hold] tranexamic acid (CYKLOKAPRON) topical -INTRAOP  2,000 mg Topical Once   Continuous Infusions: . sodium chloride 75 mL/hr at 03/25/19 0006   PRN Meds:.[MAR Hold] acetaminophen **OR** [MAR Hold] acetaminophen, [MAR Hold] albuterol, [MAR Hold] calcium carbonate, [MAR Hold] magnesium hydroxide, [MAR Hold]  morphine injection, [MAR Hold] ondansetron **OR** [MAR Hold] ondansetron (ZOFRAN) IV, [MAR Hold] oxyCODONE, [MAR Hold] traZODone  DVT prophylaxis: SCDs Code Status: Full code Family Communication: Discussed with patient Disposition Plan: To be determined, may needs SNF  Consultants:  Orthopedic surgery  Procedures:  None   Microbiology  SARS-CoV-2 negative  Antimicrobials: None     Objective: Vitals:   03/24/19 2130 03/24/19 2200 03/24/19 2356 03/25/19 0433  BP: 139/69 (!) 162/81 131/78 (!) 146/70  Pulse: 60 (!) 101 98 (!) 109  Resp: 18 19  18   Temp:   98.1 F (36.7 C) 99.1 F (37.3 C)  TempSrc:   Axillary Oral  SpO2: 95% 95% 94% 91%  Weight:      Height:        Intake/Output Summary (Last 24 hours) at 03/25/2019 1013 Last data filed at 03/25/2019 0924 Gross per 24 hour  Intake 600 ml  Output 200 ml  Net 400 ml   Filed Weights   03/24/19 1855  Weight: 57.2 kg    Examination:  Constitutional: NAD Eyes: lids and conjunctivae normal, no scleral icterus ENMT: Mucous membranes are moist.  Neck: normal, supple Respiratory: clear to auscultation bilaterally, no wheezing, no crackles. Normal respiratory effort.  Cardiovascular: Irregularly irregular, no murmurs appreciated.  No peripheral edema Abdomen: no tenderness. Bowel sounds positive.  Musculoskeletal: no  clubbing / cyanosis.  Skin: no rashes Neurologic: CN 2-12 grossly intact.  Strength equal in upper extremities, lower extremities deferred due to hip fracture Psychiatric: Normal judgment and insight. Alert and oriented x 3. Normal mood.    Data Reviewed: I have independently reviewed following labs and imaging studies   CBC: Recent Labs  Lab 03/24/19 2028 03/25/19 0430  WBC 9.3 11.8*  NEUTROABS 6.5  --   HGB 12.6 11.9*  HCT 38.8 36.9  MCV 93.0 93.9  PLT 218 195   Basic Metabolic Panel: Recent Labs  Lab 03/24/19 2028 03/25/19 0430  NA 138 134*  K 3.6 3.5  CL 104 103  CO2 26 23  GLUCOSE 145* 188*  BUN 17 16  CREATININE 0.75 0.63  CALCIUM 8.6* 8.0*   Liver Function Tests: No results for input(s): AST, ALT, ALKPHOS, BILITOT, PROT, ALBUMIN in the last 168 hours. Coagulation Profile: Recent Labs  Lab 03/24/19 2028  INR 1.1   HbA1C: Recent Labs    03/24/19 2028  HGBA1C 7.2*   CBG: Recent Labs  Lab 03/25/19 0730  GLUCAP 159*    Recent Results (from the past 240 hour(s))  SARS CORONAVIRUS 2 (TAT 6-24 HRS) Nasopharyngeal Nasopharyngeal Swab     Status: None   Collection Time: 03/24/19  8:47 PM   Specimen: Nasopharyngeal Swab  Result Value Ref Range Status   SARS Coronavirus 2 NEGATIVE NEGATIVE Final    Comment: (NOTE) SARS-CoV-2 target nucleic acids are NOT DETECTED. The SARS-CoV-2 RNA is generally detectable in upper and lower respiratory specimens during the acute phase of infection. Negative results do not preclude SARS-CoV-2 infection, do not rule out co-infections with other pathogens, and should not be used as the sole basis for treatment or other patient management decisions. Negative results must be combined with clinical observations, patient history, and epidemiological information. The expected result is Negative. Fact Sheet for Patients: HairSlick.no Fact Sheet for Healthcare Providers:  quierodirigir.com This test is not yet approved or cleared by the [place] FDA and  has been authorized for detection and/or diagnosis of SARS-CoV-2 by FDA under an Emergency Use Authorization (EUA). This EUA will remain  in effect (meaning this test can be used) for the duration of the COVID-19 declaration under Section 56 4(b)(1) of the Act, 21 U.S.C. section 360bbb-3(b)(1), unless the authorization is terminated or revoked sooner. Performed at [facility], [address] [place], [place] [address]   Surgical pcr screen     Status: None   Collection Time: 03/24/19 11:58 PM   Specimen: Nasal Mucosa; Nasal Swab  Result Value Ref Range Status   MRSA, PCR NEGATIVE NEGATIVE Final   Staphylococcus aureus NEGATIVE NEGATIVE Final    Comment: (NOTE) The Xpert SA Assay (FDA approved for NASAL specimens in patients 52 years of age and older), is one component of a comprehensive surveillance program. It is not intended to diagnose infection nor to guide or monitor treatment. Performed at [facility], [address], [place], [place] [address]  Radiology Studies: Ct Head Wo Contrast  Result Date: 03/24/2019 CLINICAL DATA:  Recent fall EXAM: CT HEAD WITHOUT CONTRAST CT CERVICAL SPINE WITHOUT CONTRAST TECHNIQUE: Multidetector CT imaging of the head and cervical spine was performed following the standard protocol without intravenous contrast. Multiplanar CT image reconstructions of the cervical spine were also generated. COMPARISON:  11/15/2017 FINDINGS: CT HEAD FINDINGS Brain: Chronic atrophic and ischemic changes are again identified. No findings to suggest acute hemorrhage, acute infarction or space-occupying mass lesion are noted. Vascular: No hyperdense vessel or unexpected calcification. Skull: Normal. Negative for fracture or focal lesion. Sinuses/Orbits: No acute finding. Other: None. CT CERVICAL SPINE FINDINGS Alignment: Mild loss of the  normal cervical lordosis is noted which appears degenerative in nature. Skull base and vertebrae: 7 cervical segments are well visualized. Disc space narrowing at C5-6 and C6-7 is seen with associated osteophytic changes. Posterior fusion defect at C1 is noted. No acute fracture or acute facet abnormality is noted. Multilevel facet hypertrophic changes are seen. Soft tissues and spinal canal: Surrounding soft tissue structures show vascular calcifications as well as multiple hypodense nodules within the thyroid. These appear similar to that seen on a prior carotid ultrasound. Upper chest: Visualized lung apices are within normal limits. Other: None IMPRESSION: CT of the head: Chronic atrophic and ischemic changes without acute abnormality. CT of the cervical spine: Multilevel degenerative change without acute abnormality. Multiple thyroid nodules are noted stable from a prior carotid ultrasound from 2019. Given the patient's age no further follow-up is recommended. Electronically Signed   By: Alcide Clever M.D.   On: 03/24/2019 20:33   Ct Cervical Spine Wo Contrast  Result Date: 03/24/2019 CLINICAL DATA:  Recent fall EXAM: CT HEAD WITHOUT CONTRAST CT CERVICAL SPINE WITHOUT CONTRAST TECHNIQUE: Multidetector CT imaging of the head and cervical spine was performed following the standard protocol without intravenous contrast. Multiplanar CT image reconstructions of the cervical spine were also generated. COMPARISON:  11/15/2017 FINDINGS: CT HEAD FINDINGS Brain: Chronic atrophic and ischemic changes are again identified. No findings to suggest acute hemorrhage, acute infarction or space-occupying mass lesion are noted. Vascular: No hyperdense vessel or unexpected calcification. Skull: Normal. Negative for fracture or focal lesion. Sinuses/Orbits: No acute finding. Other: None. CT CERVICAL SPINE FINDINGS Alignment: Mild loss of the normal cervical lordosis is noted which appears degenerative in nature. Skull base and  vertebrae: 7 cervical segments are well visualized. Disc space narrowing at C5-6 and C6-7 is seen with associated osteophytic changes. Posterior fusion defect at C1 is noted. No acute fracture or acute facet abnormality is noted. Multilevel facet hypertrophic changes are seen. Soft tissues and spinal canal: Surrounding soft tissue structures show vascular calcifications as well as multiple hypodense nodules within the thyroid. These appear similar to that seen on a prior carotid ultrasound. Upper chest: Visualized lung apices are within normal limits. Other: None IMPRESSION: CT of the head: Chronic atrophic and ischemic changes without acute abnormality. CT of the cervical spine: Multilevel degenerative change without acute abnormality. Multiple thyroid nodules are noted stable from a prior carotid ultrasound from 2019. Given the patient's age no further follow-up is recommended. Electronically Signed   By: Alcide Clever M.D.   On: 03/24/2019 20:33   Dg Knee Complete 4 Views Right  Result Date: 03/24/2019 CLINICAL DATA:  Recent fall with right knee pain, initial encounter EXAM: RIGHT KNEE - COMPLETE 4+ VIEW COMPARISON:  None. FINDINGS: Degenerative changes are noted in the medial joint space with some irregularity of the medial femoral condyle  consistent with a osteochondral defect. No joint effusion is seen. No acute fracture or dislocation is noted. IMPRESSION: Chronic degenerative changes without acute abnormality. Electronically Signed   By: Alcide CleverMark  Lukens M.D.   On: 03/24/2019 20:09   Dg Hip Unilat W Or Wo Pelvis 2-3 Views Right  Result Date: 03/24/2019 CLINICAL DATA:  Recent fall with hip pain, initial encounter EXAM: DG HIP (WITH OR WITHOUT PELVIS) 2-3V RIGHT COMPARISON:  None. FINDINGS: Pelvic ring is intact. Degenerative changes of lumbar spine are noted. Subcapital femoral neck fracture is noted with impaction at the fracture site. No focal abnormality is noted. IMPRESSION: Right subcapital femoral  neck fracture. Electronically Signed   By: Alcide CleverMark  Lukens M.D.   On: 03/24/2019 20:10   Pamella Pertostin Blayze Haen, MD, PhD [facility]  Between 7 am - 7 pm I am available, please contact me via Amion or Securechat  Between 7 pm - 7 am I am not available, please contact night coverage MD/APP via Amion

## 2019-03-25 NOTE — Transfer of Care (Signed)
Immediate Anesthesia Transfer of Care Note  Patient: Denise Cervantes  Procedure(s) Performed: ARTHROPLASTY BIPOLAR HIP (HEMIARTHROPLASTY) (Right Hip)  Patient Location: PACU  Anesthesia Type:General  Level of Consciousness: awake and patient cooperative  Airway & Oxygen Therapy: Patient Spontanous Breathing and Patient connected to nasal cannula oxygen  Post-op Assessment: Report given to RN and Post -op Vital signs reviewed and stable  Post vital signs: Reviewed and stable  Last Vitals:  Vitals Value Taken Time  BP 172/81 03/25/19 1115  Temp 37.3 C 03/25/19 1115  Pulse 94 03/25/19 1117  Resp 16 03/25/19 1117  SpO2 100 % 03/25/19 1117  Vitals shown include unvalidated device data.  Last Pain:  Vitals:   03/25/19 1115  TempSrc:   PainSc: Asleep         Complications: No apparent anesthesia complications

## 2019-03-25 NOTE — Consult Note (Signed)
ORTHOPAEDIC CONSULTATION  REQUESTING PHYSICIAN: Leatha Gilding, MD  Chief Complaint: right hip pain  HPI: Denise Cervantes is a 85 y.o. female who complains of right hip pain after mechanical fall. Please see H&P and ED notes for details. Denies any numbness, tingling or constitutional symptoms.  Past Medical History:  Diagnosis Date  . A-fib (HCC)   . Bronchitis, obstructive, chronic (HCC)   . COPD exacerbation (HCC)   . Depression   . Diabetes (HCC)    type II  . Esophageal reflux disease   . Fibromyalgia   . Glaucoma   . HBP (high blood pressure)   . Hypercholesterolemia   . Hypertension   . Memory loss    History reviewed. No pertinent surgical history. Social History   Socioeconomic History  . Marital status: Married    Spouse name: Not on file  . Number of children: Not on file  . Years of education: Not on file  . Highest education level: Not on file  Occupational History  . Occupation: retired  Engineer, production  . Financial resource strain: Not on file  . Food insecurity    Worry: Not on file    Inability: Not on file  . Transportation needs    Medical: Not on file    Non-medical: Not on file  Tobacco Use  . Smoking status: Former Smoker    Types: Cigarettes  . Smokeless tobacco: Never Used  Substance and Sexual Activity  . Alcohol use: No  . Drug use: No  . Sexual activity: Not Currently  Lifestyle  . Physical activity    Days per week: Not on file    Minutes per session: Not on file  . Stress: Not on file  Relationships  . Social Musician on phone: Not on file    Gets together: Not on file    Attends religious service: Not on file    Active member of club or organization: Not on file    Attends meetings of clubs or organizations: Not on file    Relationship status: Not on file  Other Topics Concern  . Not on file  Social History Narrative  . Not on file   History reviewed. No pertinent family history. Allergies  Allergen  Reactions  . Penicillins    Prior to Admission medications   Medication Sig Start Date End Date Taking? Authorizing Provider  acetaminophen (TYLENOL) 500 MG tablet Take 1,000 mg by mouth every 8 (eight) hours as needed for mild pain.    Yes [provider]  albuterol (PROVENTIL HFA;VENTOLIN HFA) 108 (90 Base) MCG/ACT inhaler Inhale 2 puffs into the lungs every 6 (six) hours as needed for wheezing or shortness of breath. 09/05/17   Alford Highland, MD  amiodarone (PACERONE) 200 MG tablet One tablet twice a day for seven days then one tablet daily 09/05/17   Alford Highland, MD  amLODipine (NORVASC) 5 MG tablet Take 5 mg by mouth daily.    [provider]  apixaban (ELIQUIS) 5 MG TABS tablet Take 5 mg by mouth every 12 (twelve) hours.     [provider]  Biotin 5 MG CAPS Take 1 capsule by mouth daily.    [provider]  brimonidine-timolol (COMBIGAN) 0.2-0.5 % ophthalmic solution Place 1 drop into the right eye 2 (two) times daily.    [provider]  Calcium Carbonate Antacid 400 MG CHEW Chew 400 mg by mouth daily as needed (stomach acid).  [provider]  Cholecalciferol 1000 units capsule Take 1,000 Units by mouth daily.     [provider]  doxycycline (VIBRA-TABS) 100 MG tablet Take 1 tablet (100 mg total) by mouth every 12 (twelve) hours. Patient not taking: Reported on 03/24/2019 09/05/17   Alford HighlandWieting, Richard, MD  escitalopram (LEXAPRO) 5 MG tablet Take 1 tablet by mouth daily. 08/26/17   [provider]  latanoprost (XALATAN) 0.005 % ophthalmic solution Place 1 drop into both eyes at bedtime.    [provider]  losartan (COZAAR) 100 MG tablet Take 100 mg by mouth daily.    [provider]  metFORMIN (GLUCOPHAGE) 500 MG tablet Take 500 mg by mouth 2 (two) times daily with a meal.    [provider]  mometasone-formoterol (DULERA) 100-5 MCG/ACT AERO Inhale 2 puffs into the lungs 2 (two) times  daily. 09/05/17   Alford HighlandWieting, Richard, MD  rosuvastatin (CRESTOR) 5 MG tablet Take 5 mg by mouth daily. 08/25/17   [provider]  tiotropium (SPIRIVA HANDIHALER) 18 MCG inhalation capsule Place 1 capsule (18 mcg total) into inhaler and inhale daily. 09/05/17 09/05/18  Alford HighlandWieting, Richard, MD  traZODone (DESYREL) 50 MG tablet Take 50 mg by mouth at bedtime as needed for sleep.     [provider]   Ct Head Wo Contrast  Result Date: 03/24/2019 CLINICAL DATA:  Recent fall EXAM: CT HEAD WITHOUT CONTRAST CT CERVICAL SPINE WITHOUT CONTRAST TECHNIQUE: Multidetector CT imaging of the head and cervical spine was performed following the standard protocol without intravenous contrast. Multiplanar CT image reconstructions of the cervical spine were also generated. COMPARISON:  11/15/2017 FINDINGS: CT HEAD FINDINGS Brain: Chronic atrophic and ischemic changes are again identified. No findings to suggest acute hemorrhage, acute infarction or space-occupying mass lesion are noted. Vascular: No hyperdense vessel or unexpected calcification. Skull: Normal. Negative for fracture or focal lesion. Sinuses/Orbits: No acute finding. Other: None. CT CERVICAL SPINE FINDINGS Alignment: Mild loss of the normal cervical lordosis is noted which appears degenerative in nature. Skull base and vertebrae: 7 cervical segments are well visualized. Disc space narrowing at C5-6 and C6-7 is seen with associated osteophytic changes. Posterior fusion defect at C1 is noted. No acute fracture or acute facet abnormality is noted. Multilevel facet hypertrophic changes are seen. Soft tissues and spinal canal: Surrounding soft tissue structures show vascular calcifications as well as multiple hypodense nodules within the thyroid. These appear similar to that seen on a prior carotid ultrasound. Upper chest: Visualized lung apices are within normal limits. Other: None IMPRESSION: CT of the head: Chronic atrophic and ischemic changes without  acute abnormality. CT of the cervical spine: Multilevel degenerative change without acute abnormality. Multiple thyroid nodules are noted stable from a prior carotid ultrasound from 2019. Given the patient's age no further follow-up is recommended. Electronically Signed   By: Alcide CleverMark  Lukens M.D.   On: 03/24/2019 20:33   Ct Cervical Spine Wo Contrast  Result Date: 03/24/2019 CLINICAL DATA:  Recent fall EXAM: CT HEAD WITHOUT CONTRAST CT CERVICAL SPINE WITHOUT CONTRAST TECHNIQUE: Multidetector CT imaging of the head and cervical spine was performed following the standard protocol without intravenous contrast. Multiplanar CT image reconstructions of the cervical spine were also generated. COMPARISON:  11/15/2017 FINDINGS: CT HEAD FINDINGS Brain: Chronic atrophic and ischemic changes are again identified. No findings to suggest acute hemorrhage, acute infarction or space-occupying mass lesion are noted. Vascular: No hyperdense vessel or unexpected calcification. Skull: Normal. Negative for fracture or focal lesion. Sinuses/Orbits: No acute  finding. Other: None. CT CERVICAL SPINE FINDINGS Alignment: Mild loss of the normal cervical lordosis is noted which appears degenerative in nature. Skull base and vertebrae: 7 cervical segments are well visualized. Disc space narrowing at C5-6 and C6-7 is seen with associated osteophytic changes. Posterior fusion defect at C1 is noted. No acute fracture or acute facet abnormality is noted. Multilevel facet hypertrophic changes are seen. Soft tissues and spinal canal: Surrounding soft tissue structures show vascular calcifications as well as multiple hypodense nodules within the thyroid. These appear similar to that seen on a prior carotid ultrasound. Upper chest: Visualized lung apices are within normal limits. Other: None IMPRESSION: CT of the head: Chronic atrophic and ischemic changes without acute abnormality. CT of the cervical spine: Multilevel degenerative change without  acute abnormality. Multiple thyroid nodules are noted stable from a prior carotid ultrasound from 2019. Given the patient's age no further follow-up is recommended. Electronically Signed   By: Inez Catalina M.D.   On: 03/24/2019 20:33   Dg Knee Complete 4 Views Right  Result Date: 03/24/2019 CLINICAL DATA:  Recent fall with right knee pain, initial encounter EXAM: RIGHT KNEE - COMPLETE 4+ VIEW COMPARISON:  None. FINDINGS: Degenerative changes are noted in the medial joint space with some irregularity of the medial femoral condyle consistent with a osteochondral defect. No joint effusion is seen. No acute fracture or dislocation is noted. IMPRESSION: Chronic degenerative changes without acute abnormality. Electronically Signed   By: Inez Catalina M.D.   On: 03/24/2019 20:09   Dg Hip Unilat W Or Wo Pelvis 2-3 Views Right  Result Date: 03/24/2019 CLINICAL DATA:  Recent fall with hip pain, initial encounter EXAM: DG HIP (WITH OR WITHOUT PELVIS) 2-3V RIGHT COMPARISON:  None. FINDINGS: Pelvic ring is intact. Degenerative changes of lumbar spine are noted. Subcapital femoral neck fracture is noted with impaction at the fracture site. No focal abnormality is noted. IMPRESSION: Right subcapital femoral neck fracture. Electronically Signed   By: Inez Catalina M.D.   On: 03/24/2019 20:10    Positive ROS: All other systems have been reviewed and were otherwise negative with the exception of those mentioned in the HPI and as above.  Physical Exam: General: Alert, no acute distress Cardiovascular: No pedal edema Respiratory: No cyanosis, no use of accessory musculature GI: No organomegaly, abdomen is soft and non-tender Skin: No lesions in the area of chief complaint Neurologic: Sensation intact distally Psychiatric: Patient is competent for consent with normal mood and affect Lymphatic: No axillary or cervical lymphadenopathy  MUSCULOSKELETAL: right leg short, externally rotated. LLE: FROM, nontender.  Compartments soft. Good cap refill. Motor and sensory intact distally.  Assessment: Right hip femoral neck fracture  Plan: Plan right hip hemiarthroplasty, anterior approach.  The diagnosis, risks, benefits and alternatives to treatment are all discussed in detail with the patient and family. Risks include but are not limited to bleeding, infection, deep vein thrombosis, pulmonary embolism, nerve or vascular injury, non-union, repeat operation, persistent pain, weakness, stiffness and death. She understands and is eager to proceed.    Lovell Sheehan, MD    03/25/2019 9:02 AM

## 2019-03-25 NOTE — Plan of Care (Signed)
Patient is sitting up in the chair- denies pain or discomfort. Drsg dry, intact- no additional drainage noted. Call bell in reach.  Will continue to assess.

## 2019-03-26 ENCOUNTER — Encounter: Payer: Self-pay | Admitting: Orthopedic Surgery

## 2019-03-26 LAB — CBC
HCT: 33.1 % — ABNORMAL LOW (ref 36.0–46.0)
Hemoglobin: 11.1 g/dL — ABNORMAL LOW (ref 12.0–15.0)
MCH: 30.2 pg (ref 26.0–34.0)
MCHC: 33.5 g/dL (ref 30.0–36.0)
MCV: 89.9 fL (ref 80.0–100.0)
Platelets: 171 10*3/uL (ref 150–400)
RBC: 3.68 MIL/uL — ABNORMAL LOW (ref 3.87–5.11)
RDW: 15.2 % (ref 11.5–15.5)
WBC: 12.1 10*3/uL — ABNORMAL HIGH (ref 4.0–10.5)
nRBC: 0 % (ref 0.0–0.2)

## 2019-03-26 LAB — GLUCOSE, CAPILLARY
Glucose-Capillary: 197 mg/dL — ABNORMAL HIGH (ref 70–99)
Glucose-Capillary: 320 mg/dL — ABNORMAL HIGH (ref 70–99)
Glucose-Capillary: 157 mg/dL — ABNORMAL HIGH (ref 70–99)
Glucose-Capillary: 212 mg/dL — ABNORMAL HIGH (ref 70–99)

## 2019-03-26 LAB — COMPREHENSIVE METABOLIC PANEL
ALT: 35 U/L (ref 0–44)
AST: 32 U/L (ref 15–41)
Albumin: 2.9 g/dL — ABNORMAL LOW (ref 3.5–5.0)
Alkaline Phosphatase: 45 U/L (ref 38–126)
Anion gap: 8 (ref 5–15)
BUN: 17 mg/dL (ref 8–23)
CO2: 23 mmol/L (ref 22–32)
Calcium: 8.3 mg/dL — ABNORMAL LOW (ref 8.9–10.3)
Chloride: 104 mmol/L (ref 98–111)
Creatinine, Ser: 0.63 mg/dL (ref 0.44–1.00)
GFR calc Af Amer: >60 mL/min (ref >60)
GFR calc non Af Amer: >60 mL/min (ref >60)
Glucose, Bld: 237 mg/dL — ABNORMAL HIGH (ref 70–99)
Potassium: 4.3 mmol/L (ref 3.5–5.1)
Sodium: 135 mmol/L (ref 135–145)
Total Bilirubin: 0.8 mg/dL (ref 0.3–1.2)
Total Protein: 5.9 g/dL — ABNORMAL LOW (ref 6.5–8.1)

## 2019-03-26 MED ORDER — METOPROLOL TARTRATE 5 MG/5ML IV SOLN: 5.0000 mg | INTRAVENOUS | Status: DC | PRN

## 2019-03-26 MED ORDER — DILTIAZEM HCL 30 MG PO TABS
30.0000 mg | ORAL_TABLET | Freq: Four times a day (QID) | ORAL | Status: DC
  Administered 2019-03-26 – 2019-03-27 (×3): 30 mg via ORAL
  Filled 2019-03-26 (×3): qty 1

## 2019-03-26 MED ORDER — POLYETHYLENE GLYCOL 3350 17 G PO PACK
17.0000 g | PACK | Freq: Every day | ORAL | Status: DC
  Administered 2019-03-26 – 2019-03-27 (×2): 17 g via ORAL
  Filled 2019-03-26 (×2): qty 1

## 2019-03-26 MED ORDER — DILTIAZEM HCL 30 MG PO TABS: 30.0000 mg | ORAL_TABLET | Freq: Four times a day (QID) | ORAL | Status: DC

## 2019-03-26 NOTE — TOC Initial Note (Signed)
Transition of Care [facility]) - Initial/Assessment Note    Patient Details  Name: Denise Cervantes MRN: [id number] Date of Birth: [birth year]  Transition of Care [facility]) CM/SW Contact:    Denise Butcher, RN Phone Number: 03/26/2019, 4:01 PM  Clinical Narrative:                 Patient fell at home and fractured her right hip.  Patient lives at home at [facility] independent living with her husband.  Patient reports that she helps care for her husband, he is on hospice care and he has 24/7 nursing care with [facility] agency.   PT has recommended that patient go to SNF but patient and patient's daughter wish for her to return home.  Patient will get OP PT and OT with [facility] she will need OP PT and OT order at discharge.  Daughter Denise Cervantes is at the bedside and assures RNCM that patient will also have 24/7 care at home with [facility] agency.   Plan at this time is for discharge tomorrow.  [facility] will provide transpiration, patient's daughter set up transportation for tomorrow at 2pm.   Patient needs a rolling walker at discharge, she has a rollator at home but PT recommends RW with 5" wheels.  [employer] will provide equipment and will bring to the patient's room before discharge.    Expected Discharge Plan: OP Rehab(Independent Living) Barriers to Discharge: Continued Medical Work up   Patient Goals and CMS Choice Patient states their goals for this hospitalization and ongoing recovery are:: Wants to go home with outpatient PT and OT at [facility].gov Compare Post Acute Care list provided to:: Patient Choice offered to / list presented to : Patient  Expected Discharge Plan and Services Expected Discharge Plan: OP Rehab(Independent Living)   Discharge Planning Services: CM Consult   Living arrangements for the past 2 months: Independent Living Facility                                      Prior Living Arrangements/Services Living arrangements for  the past 2 months: Independent Living Facility Lives with:: Spouse Patient language and need for interpreter reviewed:: Yes Do you feel safe going back to the place where you live?: Yes      Need for Family Participation in Patient Care: Yes (Comment)(broken hip) Care giver support system in place?: Yes (comment)(daughter) Current home services: Other (comment)(rollator) Criminal Activity/Legal Involvement Pertinent to Current Situation/Hospitalization: No - Comment as needed  Activities of Daily Living Home Assistive Devices/Equipment: Walker (specify type) ADL Screening (condition at time of admission) Patient's cognitive ability adequate to safely complete daily activities?: Yes Is the patient deaf or have difficulty hearing?: No Does the patient have difficulty seeing, even when wearing glasses/contacts?: No Does the patient have difficulty concentrating, remembering, or making decisions?: No Patient able to express need for assistance with ADLs?: Yes Does the patient have difficulty dressing or bathing?: No Independently performs ADLs?: Yes (appropriate for developmental age) Does the patient have difficulty walking or climbing stairs?: No Weakness of Legs: None Weakness of Arms/Hands: None  Permission Sought/Granted Permission sought to share information with : Case Manager, Magazine features editor, Family Supports Permission granted to share information with : Yes, Verbal Permission Granted     Permission granted to share info w AGENCY: [facility] [employer] granted to share info  w Relationship: daughter Denise Cervantes     Emotional Assessment Appearance:: Appears stated age Attitude/Demeanor/Rapport: Engaged Affect (typically observed): Accepting Orientation: : Oriented to Self, Oriented to Place, Oriented to  Time, Oriented to Situation Alcohol / Substance Use: Not Applicable Psych Involvement: No (comment)  Admission diagnosis:  Closed fracture of right hip, initial  encounter [facility]) [S72.001A] Patient Active Problem List   Diagnosis Date Noted  . Closed left hip fracture ([place]) 03/24/2019  . Pneumonia 09/02/2017   PCP:  Dion Body, MD Pharmacy:   [facility] [place], [place] [address] [place] [address] Phone: [phone] Fax: [phone]     Social Determinants of Health (SDOH) Interventions    Readmission Risk Interventions No flowsheet data found.

## 2019-03-26 NOTE — Progress Notes (Signed)
Pt showed signs of confusion this evening. Pt was frustrated stating this Probation officer had not been in her room today. This writer stopped and had a discussion with pt to assess pt's well being. Physician was notified of change in pt's state of being and of pt's HR. Physician came onto the floor, assessed pt, and order Cardizem.  Medications administer per physician's orders. Pt is stable at this time, will continue to monitor and provide care for as ordered.

## 2019-03-26 NOTE — Progress Notes (Signed)
Physical Therapy Treatment Patient Details Name: Denise Cervantes MRN: [id number] DOB: 1925-05-14 Today's Date: 03/26/2019    History of Present Illness Pt to ED post R hip hemiarthroplasty after fall tripping on husbands foley catheter while she was attempting to change it. 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 Patient endorses multiple falls/trips in the past 21mos. PMH DM2, afib, COPD, fibromyalgia, and GERD.    PT Comments    Pt alert, agreeable to PT. Pt needed minA to reposition in chair in preparation for transfers. Performed seated therapeutic exercises with AROM, decreased ROM noted for RLE but pt able to perform without physical assist. The patient demonstrated sit <> stand transfer with minA and RW, increased stiffness as well as lower surface level increased pts need for assistance. Pt ambulated ~54ft in room with intermittent MinA for balance deficits, constant verbal cues needed for ambulation technique/RW use. The patient was up in chair at end of session, all needs in reach. The patient would benefit from further skilled PT intervention to return to PLOF as able.     Follow Up Recommendations  SNF     Equipment Recommendations  Rolling walker with 5" wheels    Recommendations for Other Services       Precautions / Restrictions Precautions Precautions: Anterior Hip;Fall Precaution Booklet Issued: No Restrictions Weight Bearing Restrictions: Yes RLE Weight Bearing: Weight bearing as tolerated    Mobility  Bed Mobility               General bed mobility comments: deferred up in chair  Transfers Overall transfer level: Needs assistance Equipment used: Rolling walker (2 wheeled) Transfers: Sit to/from Stand Sit to Stand: Min assist         General transfer comment: Pt with increased stiffness, lower surface, minA to complete transfer and for  initial standing balance  Ambulation/Gait Ambulation/Gait assistance: Min assist Gait Distance (Feet): 15 Feet Assistive device: Rolling walker (2 wheeled)       General Gait Details: Pt instructed in step to gait pattern. HR elevated 130s-140s during/after mobility, returned to [facility] quickly. RN notified. Pt needed constant verbal cues for safe ambulation technique.   Stairs             Wheelchair Mobility    Modified Rankin (Stroke Patients Only)       Balance Overall balance assessment: Needs assistance Sitting-balance support: No upper extremity supported;Feet supported Sitting balance-Leahy Scale: Fair       Standing balance-Leahy Scale: Fair                              Cognition Arousal/Alertness: Awake/alert Behavior During Therapy: WFL for tasks assessed/performed Overall Cognitive Status: Within Functional Limits for tasks assessed                                        Exercises Total Joint Exercises Heel Slides: AROM;Right;5 reps;Strengthening Hip ABduction/ADduction: AROM;Strengthening;Right;5 reps Straight Leg Raises: AAROM;Strengthening;Right;5 reps Long Arc Quad: AROM;Strengthening;Both;10 reps Marching in Standing: AROM;Seated;Both;10 reps;Strengthening    General Comments        Pertinent Vitals/Pain Pain Assessment: No/denies pain    Home Living  Prior Function            PT Goals (current goals can now be found in the care plan section) Progress towards PT goals: Progressing toward goals    Frequency    BID      PT Plan Current plan remains appropriate    Co-evaluation              AM-PAC PT "6 Clicks" Mobility   Outcome Measure  Help needed turning from your back to your side while in a flat bed without using bedrails?: A Lot Help needed moving from lying on your back to sitting on the side of a flat bed without using bedrails?: A Lot Help needed  moving to and from a bed to a chair (including a wheelchair)?: A Little Help needed standing up from a chair using your arms (e.g., wheelchair or bedside chair)?: A Little Help needed to walk in hospital room?: A Little Help needed climbing 3-5 steps with a railing? : A Little 6 Click Score: 16    End of Session Equipment Utilized During Treatment: Gait belt Activity Tolerance: Patient tolerated treatment well Patient left: in chair;with chair alarm set;with call bell/phone within reach;with SCD's reapplied Nurse Communication: Mobility status;Other (comment)(elevated HR) PT Visit Diagnosis: Repeated falls (R29.6);Difficulty in walking, not elsewhere classified (R26.2);Other abnormalities of gait and mobility (R26.89);Unsteadiness on feet (R26.81);Muscle weakness (generalized) (M62.81)     Time: 2585-2778 PT Time Calculation (min) (ACUTE ONLY): 32 min  Charges:  $Gait Training: 8-22 mins $Therapeutic Exercise: 8-22 mins                     Olga Coaster PT, DPT [phone] PM,03/26/19 [phone]

## 2019-03-26 NOTE — Progress Notes (Signed)
D: Pt alert and oriented. Pt denies experiencing any pain at this time.  A: Scheduled medications administered to pt, per MD orders. Support and encouragement provided. Frequent verbal contact made.   R: No adverse drug reactions noted. Pt complaint with medications and treatment plan. Pt interacts well with staff on the unit. Pt is stable at this time, will continue to monitor and provide care as ordered.

## 2019-03-26 NOTE — Progress Notes (Signed)
PROGRESS NOTE  Denise PairKatie F Cervantes BJY:[id number] DOB: [birth year] DOA: 03/24/2019 PCP: Marisue IvanLinthavong, Kanhka, MD   LOS: 2 days   Brief Narrative / Interim history: Is a pleasant 85 year old female with history of paroxysmal A. fib, COPD, type 2 diabetes mellitus, fibromyalgia who came to the emergency room after having a fall.  She was at home trying to fix her husband's Foley catheter when she turned around and fell.  This was followed by right hip pain.  No reported syncopal events, chest pain, palpitations.  In the ED she was found to have right subcapital femoral neck fracture, orthopedic surgery was consulted  Subjective / 24h Interval events: Underwent surgery yesterday and even walked with PT last night.  She is feeling well this morning, denies any abdominal pain, nausea or vomiting.  She is constipated about get some MiraLAX.  Has some soreness at the surgical site but no significant pain  Assessment & Plan: Principal Problem Closed right hip fracture secondary to mechanical fall -Orthopedic surgery consulted, patient is status post right anterior hip hemiarthroplasty by Dr. Cassell SmilesJames Bowers on 03/25/2019 -Continue to monitor postop, PT eval, monitor blood counts for blood loss anemia -Recommendations are in place for SNF, will talk to social worker today  Active Problems COPD -no wheezing, no evidence of current exacerbation or issues. -Respiratory status stable today  Hypertension -Resume her home medications  A. fib -Currently in A. fib, rate controlled with amiodarone.  She is normally on Eliquis which is now on hold perioperatively.  Resume Eliquis per orthopedic surgery, possibly today or tomorrow  Type 2 diabetes mellitus -Continue sliding scale, hold home Metformin  CBG (last 3)  Recent Labs    03/25/19 1651 03/25/19 2220 03/26/19 0755  GLUCAP 292* 269* 197*    Depression -Continue Lexapro   Scheduled Meds: . acetaminophen  500 mg Oral Q6H  . amiodarone  200 mg  Oral Daily  . amLODipine  5 mg Oral Daily  . brimonidine  1 drop Right Eye BID   And  . timolol  1 drop Right Eye BID  . cholecalciferol  1,000 Units Oral Daily  . docusate sodium  100 mg Oral BID  . enoxaparin (LOVENOX) injection  40 mg Subcutaneous Q24H  . escitalopram  5 mg Oral Daily  . insulin aspart  0-9 Units Subcutaneous TID WC  . Ipratropium-Albuterol  1 puff Inhalation QID  . ketorolac  15 mg Intravenous Q6H  . latanoprost  1 drop Both Eyes QHS  . losartan  100 mg Oral Daily  . rosuvastatin  5 mg Oral Daily  . tiotropium  18 mcg Inhalation Daily  . tranexamic acid (CYKLOKAPRON) topical -INTRAOP  2,000 mg Topical Once   Continuous Infusions: . lactated ringers 75 mL/hr at 03/26/19 0415  . methocarbamol (ROBAXIN) IV     PRN Meds:.acetaminophen **OR** acetaminophen, albuterol, alum & mag hydroxide-simeth, bisacodyl, calcium carbonate, HYDROcodone-acetaminophen, magnesium citrate, magnesium hydroxide, menthol-cetylpyridinium **OR** phenol, methocarbamol **OR** methocarbamol (ROBAXIN) IV, metoCLOPramide **OR** metoCLOPramide (REGLAN) injection, morphine injection, morphine injection, ondansetron **OR** ondansetron (ZOFRAN) IV, oxyCODONE, traZODone  DVT prophylaxis: SCDs Code Status: Full code Family Communication: Discussed with patient Disposition Plan: SNF when bed available, 1 to 2 days  Consultants:  Orthopedic surgery  Procedures:  None   Microbiology  SARS-CoV-2 negative  Antimicrobials: None     Objective: Vitals:   03/25/19 1858 03/25/19 2302 03/26/19 0356 03/26/19 0800  BP: 117/69 118/62 (!) 153/71 (!) 146/88  Pulse: 77 87 81 97  Resp: 16 18 18  Temp: 97.8 F (36.6 C) 98.1 F (36.7 C)    TempSrc: Oral Oral    SpO2: 97% 94% 96% 98%  Weight:      Height:        Intake/Output Summary (Last 24 hours) at 03/26/2019 1007 Last data filed at 03/26/2019 0355 Gross per 24 hour  Intake 1440 ml  Output 600 ml  Net 840 ml   Filed Weights   03/24/19  1855  Weight: 57.2 kg    Examination:  Constitutional: No distress, eating breakfast Eyes: No scleral icterus ENMT: Moist mucous membranes Neck: normal, supple Respiratory: Clear bilaterally, no wheezing, normal respiratory effort Cardiovascular: Irregularly irregular, no murmurs, no edema Abdomen: Soft, nontender, nondistended, positive bowel sounds Musculoskeletal: no clubbing / cyanosis.  Skin: No rashes seen Neurologic: Nonfocal, equal strength Psychiatric: Normal judgment and insight. Alert and oriented x 3. Normal mood.    Data Reviewed: I have independently reviewed following labs and imaging studies   CBC: Recent Labs  Lab 03/24/19 2028 03/25/19 0430 03/26/19 0439  WBC 9.3 11.8* 12.1*  NEUTROABS 6.5  --   --   HGB 12.6 11.9* 11.1*  HCT 38.8 36.9 33.1*  MCV 93.0 93.9 89.9  PLT 218 195 562   Basic Metabolic Panel: Recent Labs  Lab 03/24/19 2028 03/25/19 0430 03/26/19 0439  NA 138 134* 135  K 3.6 3.5 4.3  CL 104 103 104  CO2 26 23 23   GLUCOSE 145* 188* 237*  BUN 17 16 17   CREATININE 0.75 0.63 0.63  CALCIUM 8.6* 8.0* 8.3*   Liver Function Tests: Recent Labs  Lab 03/26/19 0439  AST 32  ALT 35  ALKPHOS 45  BILITOT 0.8  PROT 5.9*  ALBUMIN 2.9*   Coagulation Profile: Recent Labs  Lab 03/24/19 2028  INR 1.1   HbA1C: Recent Labs    03/24/19 2028  HGBA1C 7.2*   CBG: Recent Labs  Lab 03/25/19 1233 03/25/19 1649 03/25/19 1651 03/25/19 2220 03/26/19 0755  GLUCAP 188* 337* 292* 269* 197*    Recent Results (from the past 240 hour(s))  SARS CORONAVIRUS 2 (TAT 6-24 HRS) Nasopharyngeal Nasopharyngeal Swab     Status: None   Collection Time: 03/24/19  8:47 PM   Specimen: Nasopharyngeal Swab  Result Value Ref Range Status   SARS Coronavirus 2 NEGATIVE NEGATIVE Final    Comment: (NOTE) SARS-CoV-2 target nucleic acids are NOT DETECTED. The SARS-CoV-2 RNA is generally detectable in upper and lower respiratory specimens during the acute phase  of infection. Negative results do not preclude SARS-CoV-2 infection, do not rule out co-infections with other pathogens, and should not be used as the sole basis for treatment or other patient management decisions. Negative results must be combined with clinical observations, patient history, and epidemiological information. The expected result is Negative. Fact Sheet for Patients: SugarRoll.be Fact Sheet for Healthcare Providers: https://www.woods-mathews.com/ This test is not yet approved or cleared by the [place] FDA and  has been authorized for detection and/or diagnosis of SARS-CoV-2 by FDA under an Emergency Use Authorization (EUA). This EUA will remain  in effect (meaning this test can be used) for the duration of the COVID-19 declaration under Section 56 4(b)(1) of the Act, 21 U.S.C. section 360bbb-3(b)(1), unless the authorization is terminated or revoked sooner. Performed at [facility], [place] [address], [place], [place] [address]   Surgical pcr screen     Status: None   Collection Time: 03/24/19 11:58 PM   Specimen: Nasal Mucosa; Nasal Swab  Result  Value Ref Range Status   MRSA, PCR NEGATIVE NEGATIVE Final   Staphylococcus aureus NEGATIVE NEGATIVE Final    Comment: (NOTE) The Xpert SA Assay (FDA approved for NASAL specimens in patients 43 years of age and older), is one component of a comprehensive surveillance program. It is not intended to diagnose infection nor to guide or monitor treatment. Performed at [facility], [address], [place], [place] [address]      Radiology Studies: Dg Hip Operative Unilat With Pelvis Right  Result Date: 03/25/2019 CLINICAL DATA:  Right hip hemiarthroplasty EXAM: OPERATIVE right HIP (WITH PELVIS IF PERFORMED) 1 VIEWS TECHNIQUE: Fluoroscopic spot image(s) were submitted for interpretation post-operatively. COMPARISON:  03/24/2019 FINDINGS: Frontal projection of the  right hip demonstrates hemiarthroplasty in place without complicating feature. IMPRESSION: 1. Right hip hemiarthroplasty in place without visible complicating feature. Electronically Signed   By: Gaylyn Rong M.D.   On: 03/25/2019 17:18   Pamella Pert, MD, PhD [facility]  Between 7 am - 7 pm I am available, please contact me via Amion or Securechat  Between 7 pm - 7 am I am not available, please contact night coverage MD/APP via Amion

## 2019-03-26 NOTE — Progress Notes (Signed)
Subjective:  Patient reports pain as mild.    Objective:   VITALS:   Vitals:   03/25/19 1858 03/25/19 2302 03/26/19 0356 03/26/19 0800  BP: 117/69 118/62 (!) 153/71 (!) 146/88  Pulse: 77 87 81 97  Resp: 16 18 18    Temp: 97.8 F (36.6 C) 98.1 F (36.7 C)    TempSrc: Oral Oral    SpO2: 97% 94% 96% 98%  Weight:      Height:        PHYSICAL EXAM:  Neurologically intact ABD soft Neurovascular intact Sensation intact distally Intact pulses distally Dorsiflexion/Plantar flexion intact Incision: dressing C/D/I and scant drainage No cellulitis present Compartment soft  LABS  Results for orders placed or performed during the hospital encounter of 03/24/19 (from the past 24 hour(s))  Glucose, capillary     Status: Abnormal   Collection Time: 03/25/19 12:33 PM  Result Value Ref Range   Glucose-Capillary 188 (H) 70 - 99 mg/dL  Glucose, capillary     Status: Abnormal   Collection Time: 03/25/19  4:49 PM  Result Value Ref Range   Glucose-Capillary 337 (H) 70 - 99 mg/dL  Glucose, capillary     Status: Abnormal   Collection Time: 03/25/19  4:51 PM  Result Value Ref Range   Glucose-Capillary 292 (H) 70 - 99 mg/dL  Glucose, capillary     Status: Abnormal   Collection Time: 03/25/19 10:20 PM  Result Value Ref Range   Glucose-Capillary 269 (H) 70 - 99 mg/dL  Comprehensive metabolic panel tomorrow     Status: Abnormal   Collection Time: 03/26/19  4:39 AM  Result Value Ref Range   Sodium 135 135 - 145 mmol/L   Potassium 4.3 3.5 - 5.1 mmol/L   Chloride 104 98 - 111 mmol/L   CO2 23 22 - 32 mmol/L   Glucose, Bld 237 (H) 70 - 99 mg/dL   BUN 17 8 - 23 mg/dL   Creatinine, Ser 1.610.63 0.44 - 1.00 mg/dL   Calcium 8.3 (L) 8.9 - 10.3 mg/dL   Total Protein 5.9 (L) 6.5 - 8.1 g/dL   Albumin 2.9 (L) 3.5 - 5.0 g/dL   AST 32 15 - 41 U/L   ALT 35 0 - 44 U/L   Alkaline Phosphatase 45 38 - 126 U/L   Total Bilirubin 0.8 0.3 - 1.2 mg/dL   GFR calc non Af Amer >60 >60 mL/min   GFR calc Af  Amer >60 >60 mL/min   Anion gap 8 5 - 15  CBC Tomorrow     Status: Abnormal   Collection Time: 03/26/19  4:39 AM  Result Value Ref Range   WBC 12.1 (H) 4.0 - 10.5 K/uL   RBC 3.68 (L) 3.87 - 5.11 MIL/uL   Hemoglobin 11.1 (L) 12.0 - 15.0 g/dL   HCT 09.633.1 (L) 04.536.0 - 40.946.0 %   MCV 89.9 80.0 - 100.0 fL   MCH 30.2 26.0 - 34.0 pg   MCHC 33.5 30.0 - 36.0 g/dL   RDW 81.115.2 91.411.5 - 78.215.5 %   Platelets 171 150 - 400 K/uL   nRBC 0.0 0.0 - 0.2 %  Glucose, capillary     Status: Abnormal   Collection Time: 03/26/19  7:55 AM  Result Value Ref Range   Glucose-Capillary 197 (H) 70 - 99 mg/dL    Ct Head Wo Contrast  Result Date: 03/24/2019 CLINICAL DATA:  Recent fall EXAM: CT HEAD WITHOUT CONTRAST CT CERVICAL SPINE WITHOUT CONTRAST TECHNIQUE: Multidetector CT imaging of the head  and cervical spine was performed following the standard protocol without intravenous contrast. Multiplanar CT image reconstructions of the cervical spine were also generated. COMPARISON:  11/15/2017 FINDINGS: CT HEAD FINDINGS Brain: Chronic atrophic and ischemic changes are again identified. No findings to suggest acute hemorrhage, acute infarction or space-occupying mass lesion are noted. Vascular: No hyperdense vessel or unexpected calcification. Skull: Normal. Negative for fracture or focal lesion. Sinuses/Orbits: No acute finding. Other: None. CT CERVICAL SPINE FINDINGS Alignment: Mild loss of the normal cervical lordosis is noted which appears degenerative in nature. Skull base and vertebrae: 7 cervical segments are well visualized. Disc space narrowing at C5-6 and C6-7 is seen with associated osteophytic changes. Posterior fusion defect at C1 is noted. No acute fracture or acute facet abnormality is noted. Multilevel facet hypertrophic changes are seen. Soft tissues and spinal canal: Surrounding soft tissue structures show vascular calcifications as well as multiple hypodense nodules within the thyroid. These appear similar to that seen  on a prior carotid ultrasound. Upper chest: Visualized lung apices are within normal limits. Other: None IMPRESSION: CT of the head: Chronic atrophic and ischemic changes without acute abnormality. CT of the cervical spine: Multilevel degenerative change without acute abnormality. Multiple thyroid nodules are noted stable from a prior carotid ultrasound from 2019. Given the patient's age no further follow-up is recommended. Electronically Signed   By: Inez Catalina M.D.   On: 03/24/2019 20:33   Ct Cervical Spine Wo Contrast  Result Date: 03/24/2019 CLINICAL DATA:  Recent fall EXAM: CT HEAD WITHOUT CONTRAST CT CERVICAL SPINE WITHOUT CONTRAST TECHNIQUE: Multidetector CT imaging of the head and cervical spine was performed following the standard protocol without intravenous contrast. Multiplanar CT image reconstructions of the cervical spine were also generated. COMPARISON:  11/15/2017 FINDINGS: CT HEAD FINDINGS Brain: Chronic atrophic and ischemic changes are again identified. No findings to suggest acute hemorrhage, acute infarction or space-occupying mass lesion are noted. Vascular: No hyperdense vessel or unexpected calcification. Skull: Normal. Negative for fracture or focal lesion. Sinuses/Orbits: No acute finding. Other: None. CT CERVICAL SPINE FINDINGS Alignment: Mild loss of the normal cervical lordosis is noted which appears degenerative in nature. Skull base and vertebrae: 7 cervical segments are well visualized. Disc space narrowing at C5-6 and C6-7 is seen with associated osteophytic changes. Posterior fusion defect at C1 is noted. No acute fracture or acute facet abnormality is noted. Multilevel facet hypertrophic changes are seen. Soft tissues and spinal canal: Surrounding soft tissue structures show vascular calcifications as well as multiple hypodense nodules within the thyroid. These appear similar to that seen on a prior carotid ultrasound. Upper chest: Visualized lung apices are within normal  limits. Other: None IMPRESSION: CT of the head: Chronic atrophic and ischemic changes without acute abnormality. CT of the cervical spine: Multilevel degenerative change without acute abnormality. Multiple thyroid nodules are noted stable from a prior carotid ultrasound from 2019. Given the patient's age no further follow-up is recommended. Electronically Signed   By: Inez Catalina M.D.   On: 03/24/2019 20:33   Dg Knee Complete 4 Views Right  Result Date: 03/24/2019 CLINICAL DATA:  Recent fall with right knee pain, initial encounter EXAM: RIGHT KNEE - COMPLETE 4+ VIEW COMPARISON:  None. FINDINGS: Degenerative changes are noted in the medial joint space with some irregularity of the medial femoral condyle consistent with a osteochondral defect. No joint effusion is seen. No acute fracture or dislocation is noted. IMPRESSION: Chronic degenerative changes without acute abnormality. Electronically Signed   By: Inez Catalina  M.D.   On: 03/24/2019 20:09   Dg Hip Operative Unilat With Pelvis Right  Result Date: 03/25/2019 CLINICAL DATA:  Right hip hemiarthroplasty EXAM: OPERATIVE right HIP (WITH PELVIS IF PERFORMED) 1 VIEWS TECHNIQUE: Fluoroscopic spot image(s) were submitted for interpretation post-operatively. COMPARISON:  03/24/2019 FINDINGS: Frontal projection of the right hip demonstrates hemiarthroplasty in place without complicating feature. IMPRESSION: 1. Right hip hemiarthroplasty in place without visible complicating feature. Electronically Signed   By: Gaylyn Rong M.D.   On: 03/25/2019 17:18   Dg Hip Unilat W Or Wo Pelvis 2-3 Views Right  Result Date: 03/24/2019 CLINICAL DATA:  Recent fall with hip pain, initial encounter EXAM: DG HIP (WITH OR WITHOUT PELVIS) 2-3V RIGHT COMPARISON:  None. FINDINGS: Pelvic ring is intact. Degenerative changes of lumbar spine are noted. Subcapital femoral neck fracture is noted with impaction at the fracture site. No focal abnormality is noted. IMPRESSION:  Right subcapital femoral neck fracture. Electronically Signed   By: Alcide Clever M.D.   On: 03/24/2019 20:10    Assessment/Plan: 1 Day Post-Op   Active Problems:   Closed left hip fracture (HCC)   Advance diet Up with therapy  Discharge per medicine Continue Lovenox X 14 days after surgery WBAT right lower extremity Follow up in Dr. Odis Luster office Dec 1 call for appointment [phone]  Altamese Cabal , PA-C 03/26/2019, 12:30 PM

## 2019-03-26 NOTE — Progress Notes (Addendum)
Physical Therapy Treatment Patient Details Name: Denise Cervantes MRN: [id number] DOB: [birth year] Today's Date: 03/26/2019    History of Present Illness Pt to ED post R hip hemiarthroplasty after fall tripping on husbands foley catheter while she was attempting to change it. 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 Patient endorses multiple falls/trips in the past 33mos. PMH DM2, afib, COPD, fibromyalgia, and GERD.    PT Comments    Patient in bed, awake. Some mild confusion initially but pt oriented to self, place, situation. The patient denied pain at rest or with mobility this session. The patient performed some therapeutic exercises in supine, very little physical assist needed for RLE SLR. Supine to sit with minA for RLE management and trunk elevation. The patient was able to sit EOB for 1-2 minutes with supervision. Sit <> stand minA with RW, and ambulated to chair in room ~37ft. Instructed in step to gait pattern, mild unsteadiness noticed but CGA majority of the time. HR elevated to 120's with exertion this AM, RN notified. Pt up in chair, ready for breakfast in room, further mobility held. The patient would benefit from further skilled PT intervention to continue to progress towards goals as able.     Follow Up Recommendations  SNF     Equipment Recommendations  Rolling walker with 5" wheels    Recommendations for Other Services       Precautions / Restrictions Precautions Precautions: Anterior Hip;Fall Precaution Booklet Issued: No Restrictions Weight Bearing Restrictions: Yes RLE Weight Bearing: Weight bearing as tolerated    Mobility  Bed Mobility Overal bed mobility: Needs Assistance Bed Mobility: Supine to Sit     Supine to sit: Min assist;HOB elevated     General bed mobility comments: extra time needed, minA for RLE movement and complete trunk  elevation  Transfers Overall transfer level: Needs assistance Equipment used: Rolling walker (2 wheeled) Transfers: Sit to/from Stand Sit to Stand: Min guard         General transfer comment: improved ability to transfer this session  Ambulation/Gait Ambulation/Gait assistance: Min assist Gait Distance (Feet): 5 Feet Assistive device: Rolling walker (2 wheeled)       General Gait Details: Pt instructed in step to gait pattern. HR elevated and breakfast in room, further mobility deferred.   Stairs             Wheelchair Mobility    Modified Rankin (Stroke Patients Only)       Balance Overall balance assessment: Needs assistance Sitting-balance support: No upper extremity supported;Feet supported Sitting balance-Leahy Scale: Fair Sitting balance - Comments: Can maintain balance momentarily without UE support, cannot tolerate challenge                                    Cognition Arousal/Alertness: Awake/alert Behavior During Therapy: WFL for tasks assessed/performed Overall Cognitive Status: Within Functional Limits for tasks assessed                                 General Comments: initial confusion noted when PT first entered room, but oriented to self, situation, place      Exercises Total Joint Exercises Heel Slides: AROM;Right;5 reps;Strengthening Hip ABduction/ADduction: AROM;Strengthening;Right;5 reps Straight Leg Raises: AAROM;Strengthening;Right;5 reps Other Exercises Other Exercises: Pt with  elevated HR with mobility, >115, returns to low 90s-100s with rest. Pt asymptomatic    General Comments        Pertinent Vitals/Pain Pain Assessment: No/denies pain    Home Living                      Prior Function            PT Goals (current goals can now be found in the care plan section) Progress towards PT goals: Progressing toward goals    Frequency    BID     PT Plan Current plan remains  appropriate    Co-evaluation              AM-PAC PT "6 Clicks" Mobility   Outcome Measure  Help needed turning from your back to your side while in a flat bed without using bedrails?: A Lot Help needed moving from lying on your back to sitting on the side of a flat bed without using bedrails?: A Lot Help needed moving to and from a bed to a chair (including a wheelchair)?: A Little Help needed standing up from a chair using your arms (e.g., wheelchair or bedside chair)?: A Little Help needed to walk in hospital room?: A Little Help needed climbing 3-5 steps with a railing? : A Little 6 Click Score: 16    End of Session Equipment Utilized During Treatment: Gait belt Activity Tolerance: Patient tolerated treatment well Patient left: in chair;with chair alarm set;with call bell/phone within reach;with SCD's reapplied Nurse Communication: Mobility status;Other (comment)(HR) PT Visit Diagnosis: Repeated falls (R29.6);Difficulty in walking, not elsewhere classified (R26.2);Other abnormalities of gait and mobility (R26.89);Unsteadiness on feet (R26.81);Muscle weakness (generalized) (M62.81)     Time: 6237-6283 PT Time Calculation (min) (ACUTE ONLY): 30 min  Charges:  $Therapeutic Exercise: 23-37 mins                     Lieutenant Diego PT, DPT 11:17 AM,03/26/19 [phone]

## 2019-03-27 LAB — CBC
HCT: 34.9 % — ABNORMAL LOW (ref 36.0–46.0)
Hemoglobin: 11.7 g/dL — ABNORMAL LOW (ref 12.0–15.0)
MCH: 30.2 pg (ref 26.0–34.0)
MCHC: 33.5 g/dL (ref 30.0–36.0)
MCV: 89.9 fL (ref 80.0–100.0)
Platelets: 194 10*3/uL (ref 150–400)
RBC: 3.88 MIL/uL (ref 3.87–5.11)
RDW: 15.9 % — ABNORMAL HIGH (ref 11.5–15.5)
WBC: 12.2 10*3/uL — ABNORMAL HIGH (ref 4.0–10.5)
nRBC: 0 % (ref 0.0–0.2)

## 2019-03-27 LAB — COMPREHENSIVE METABOLIC PANEL
ALT: 41 U/L (ref 0–44)
AST: 43 U/L — ABNORMAL HIGH (ref 15–41)
Albumin: 2.6 g/dL — ABNORMAL LOW (ref 3.5–5.0)
Alkaline Phosphatase: 49 U/L (ref 38–126)
Anion gap: 10 (ref 5–15)
BUN: 11 mg/dL (ref 8–23)
CO2: 22 mmol/L (ref 22–32)
Calcium: 8.1 mg/dL — ABNORMAL LOW (ref 8.9–10.3)
Chloride: 103 mmol/L (ref 98–111)
Creatinine, Ser: 0.59 mg/dL (ref 0.44–1.00)
GFR calc Af Amer: >60 mL/min (ref >60)
GFR calc non Af Amer: >60 mL/min (ref >60)
Glucose, Bld: 168 mg/dL — ABNORMAL HIGH (ref 70–99)
Potassium: 4.4 mmol/L (ref 3.5–5.1)
Sodium: 135 mmol/L (ref 135–145)
Total Bilirubin: 1.4 mg/dL — ABNORMAL HIGH (ref 0.3–1.2)
Total Protein: 5.5 g/dL — ABNORMAL LOW (ref 6.5–8.1)

## 2019-03-27 LAB — SURGICAL PATHOLOGY

## 2019-03-27 LAB — APTT: aPTT: 41 seconds — ABNORMAL HIGH (ref 24–36)

## 2019-03-27 LAB — GLUCOSE, CAPILLARY
Glucose-Capillary: 183 mg/dL — ABNORMAL HIGH (ref 70–99)
Glucose-Capillary: 241 mg/dL — ABNORMAL HIGH (ref 70–99)
Glucose-Capillary: 199 mg/dL — ABNORMAL HIGH (ref 70–99)
Glucose-Capillary: 214 mg/dL — ABNORMAL HIGH (ref 70–99)

## 2019-03-27 MED ORDER — KETOROLAC TROMETHAMINE 15 MG/ML IJ SOLN: 15.0000 mg | Freq: Three times a day (TID) | INTRAMUSCULAR | Status: DC | PRN

## 2019-03-27 MED ORDER — APIXABAN 2.5 MG PO TABS
2.5000 mg | ORAL_TABLET | Freq: Two times a day (BID) | ORAL | Status: DC
  Administered 2019-03-27 – 2019-03-28 (×2): 2.5 mg via ORAL
  Filled 2019-03-27 (×2): qty 1

## 2019-03-27 MED ORDER — APIXABAN 5 MG PO TABS: 5.0000 mg | ORAL_TABLET | Freq: Two times a day (BID) | ORAL | Status: DC

## 2019-03-27 MED ORDER — DILTIAZEM HCL 30 MG PO TABS
60.0000 mg | ORAL_TABLET | Freq: Two times a day (BID) | ORAL | Status: DC
  Administered 2019-03-27 – 2019-03-28 (×3): 60 mg via ORAL
  Filled 2019-03-27 (×3): qty 2

## 2019-03-27 NOTE — Progress Notes (Signed)
Physical Therapy Treatment Patient Details Name: Denise Cervantes MRN: [id number] DOB: 1925/11/16 Today's Date: 03/27/2019    History of Present Illness Pt to ED post R hip hemiarthroplasty after fall tripping on husbands foley catheter while she was attempting to change it. 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 Patient endorses multiple falls/trips in the past 69mos. PMH DM2, afib, COPD, fibromyalgia, and GERD.    PT Comments    Pt alert, agreeable to PT, pre-medicated prior to PT session. The patient did not report pain but did exhibit pain signs/symptoms with R leg movement/weight bearing. The patient had significantly more difficulty with mobility this session; she endorsed that she was very stiff. The patient was a modA from bed mobility and sit <> stand with RW, and maxA to ambulate a total of 35ft in room to [facility] and then to the recliner. Pt exhibited significant difficulty with R foot clearance, weight bearing through RLE and RW management. MaxA to maintain upright positioning, RW management and step by step cueing. Pt up in chair at end of session. The patient would benefit from further skilled PT intervention to maximize mobility, safety, and goals.     Follow Up Recommendations  SNF     Equipment Recommendations  Rolling walker with 5" wheels    Recommendations for Other Services       Precautions / Restrictions Precautions Precautions: Anterior Hip;Fall Precaution Booklet Issued: Yes (comment) Restrictions Weight Bearing Restrictions: Yes RLE Weight Bearing: Weight bearing as tolerated    Mobility  Bed Mobility Overal bed mobility: Needs Assistance Bed Mobility: Supine to Sit     Supine to sit: Mod assist;HOB elevated     General bed mobility comments: pt with significant more difficulty with bed mobility this session  Transfers Overall transfer level:  Needs assistance Equipment used: Rolling walker (2 wheeled) Transfers: Sit to/from Stand Sit to Stand: Mod assist;Min assist         General transfer comment: modA from bed surface, minA from [facility]  Ambulation/Gait Ambulation/Gait assistance: Max assist Gait Distance (Feet): 6 Feet Assistive device: Rolling walker (2 wheeled)   Gait velocity: decreased   General Gait Details: Pt with significant difficulty moving RLE this session, significantly decreased weight bearing. MaxA to maintain upright positioning, RW management and step by step cueing.   Stairs             Wheelchair Mobility    Modified Rankin (Stroke Patients Only)       Balance Overall balance assessment: Needs assistance Sitting-balance support: Feet supported Sitting balance-Leahy Scale: Fair Sitting balance - Comments: Pt needed minA initially, did progress to CGA, assistance needed to shift weight Postural control: Posterior lean   Standing balance-Leahy Scale: Zero Standing balance comment: significantly limited standing balance this session                            Cognition Arousal/Alertness: Awake/alert Behavior During Therapy: WFL for tasks assessed/performed Overall Cognitive Status: Within Functional Limits for tasks assessed                                 General Comments: initial confusion noted intermittently but oriented to self, situation, place      Exercises Total Joint Exercises Heel Slides: Strengthening;10 reps;AAROM;Right Hip ABduction/ADduction: Strengthening;Right;AAROM;10 reps Straight  Leg Raises: AROM;Left;10 reps;Strengthening    General Comments        Pertinent Vitals/Pain Pain Assessment: No/denies pain    Home Living                      Prior Function            PT Goals (current goals can now be found in the care plan section) Progress towards PT goals: Progressing toward goals    Frequency    BID       PT Plan Current plan remains appropriate    Co-evaluation              AM-PAC PT "6 Clicks" Mobility   Outcome Measure  Help needed turning from your back to your side while in a flat bed without using bedrails?: A Lot Help needed moving from lying on your back to sitting on the side of a flat bed without using bedrails?: A Lot Help needed moving to and from a bed to a chair (including a wheelchair)?: A Lot Help needed standing up from a chair using your arms (e.g., wheelchair or bedside chair)?: A Lot Help needed to walk in hospital room?: A Lot Help needed climbing 3-5 steps with a railing? : Total 6 Click Score: 11    End of Session Equipment Utilized During Treatment: Gait belt Activity Tolerance: Patient limited by fatigue Patient left: in chair;with chair alarm set;with call bell/phone within reach;with SCD's reapplied Nurse Communication: Mobility status PT Visit Diagnosis: Repeated falls (R29.6);Difficulty in walking, not elsewhere classified (R26.2);Other abnormalities of gait and mobility (R26.89);Unsteadiness on feet (R26.81);Muscle weakness (generalized) (M62.81)     Time: 1610-9604 PT Time Calculation (min) (ACUTE ONLY): 36 min  Charges:  $Therapeutic Exercise: 23-37 mins                    Olga Coaster PT, DPT 11:00 AM,03/27/19 [phone]

## 2019-03-27 NOTE — Progress Notes (Signed)
Physical Therapy Treatment Patient Details Name: Denise Cervantes MRN: [id number] DOB: December 19, 1925 Today's Date: 03/27/2019    History of Present Illness Pt to ED post R hip hemiarthroplasty after fall tripping on husbands foley catheter while she was attempting to change it. 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 Patient endorses multiple falls/trips in the past 60mos. PMH DM2, afib, COPD, fibromyalgia, and GERD.    PT Comments    Patient had just finished transferring to bed with CNA at this time, further mobility deferred due to patient fatigue and very recent transfer. The patient and PT performed supine therapeutic exercises, majority of exercises performed AAROM for RLE. Improved muscle activation noted with repetition. The patient with eyes closed and comfortable in supine at end of session. The patient would benefit from further skilled PT intervention to progress towards goals as able.      Follow Up Recommendations  SNF     Equipment Recommendations  Rolling walker with 5" wheels    Recommendations for Other Services       Precautions / Restrictions Precautions Precautions: Anterior Hip;Fall Restrictions Weight Bearing Restrictions: Yes RLE Weight Bearing: Weight bearing as tolerated    Mobility  Bed Mobility               General bed mobility comments: Deferred, pt just returned to bed with CNA when PT entered the room  Transfers                    Ambulation/Gait                 Stairs             Wheelchair Mobility    Modified Rankin (Stroke Patients Only)       Balance                                            Cognition Arousal/Alertness: Awake/alert Behavior During Therapy: WFL for tasks assessed/performed Overall Cognitive Status: Within Functional Limits for tasks assessed                                 General Comments: confusion noted intermittently but oriented to self, situation, place      Exercises Total Joint Exercises Ankle Circles/Pumps: AROM;Both;10 reps;Supine Quad Sets: AROM;Supine;Both;10 reps Gluteal Sets: AROM;Strengthening;Both;10 reps Towel Squeeze: AROM;Strengthening;Both;10 reps Short Arc Quad: AROM;Strengthening;Both;10 reps Heel Slides: Strengthening;10 reps;Right;AAROM;Left;AROM Hip ABduction/ADduction: AAROM;Strengthening;Right;10 reps;AROM;Left Straight Leg Raises: AAROM;Strengthening;Left;10 reps;AROM;Right    General Comments        Pertinent Vitals/Pain Pain Assessment: No/denies pain    Home Living                      Prior Function            PT Goals (current goals can now be found in the care plan section) Progress towards PT goals: Progressing toward goals    Frequency    BID      PT Plan Current plan remains appropriate    Co-evaluation              AM-PAC PT "6 Clicks" Mobility   Outcome Measure  Help needed turning from your back  to your side while in a flat bed without using bedrails?: A Lot Help needed moving from lying on your back to sitting on the side of a flat bed without using bedrails?: A Lot Help needed moving to and from a bed to a chair (including a wheelchair)?: A Lot Help needed standing up from a chair using your arms (e.g., wheelchair or bedside chair)?: A Lot Help needed to walk in hospital room?: A Lot Help needed climbing 3-5 steps with a railing? : Total 6 Click Score: 11    End of Session Equipment Utilized During Treatment: Gait belt Activity Tolerance: Patient limited by fatigue Patient left: in bed;with bed alarm set;with SCD's reapplied;with call bell/phone within reach Nurse Communication: Mobility status PT Visit Diagnosis: Repeated falls (R29.6);Difficulty in walking, not elsewhere classified (R26.2);Other abnormalities of gait and mobility  (R26.89);Unsteadiness on feet (R26.81);Muscle weakness (generalized) (M62.81)     Time: 0254-2706 PT Time Calculation (min) (ACUTE ONLY): 23 min  Charges:  $Therapeutic Exercise: 23-37 mins                     Lieutenant Diego PT, DPT 3:17 PM,03/27/19 [phone]

## 2019-03-27 NOTE — Consult Note (Addendum)
ANTICOAGULATION CONSULT NOTE - Initial Consult  Pharmacy Consult for Apixaban Dosing Indication: atrial fibrillation  Allergies  Allergen Reactions  . Penicillins     Patient Measurements: Height: 5\' 2"  (157.5 cm) Weight: 126 lb (57.2 kg) IBW/kg (Calculated) : 50.1 Heparin Dosing Weight: 57.2 kg  Vital Signs: Temp: 98.5 F (36.9 C) (11/17 0736) Temp Source: Oral (11/17 0736) BP: 182/85 (11/17 0736) Pulse Rate: 106 (11/17 0736)  Labs: Recent Labs    03/24/19 2028 03/25/19 0430 03/26/19 0439 03/27/19 0429  HGB 12.6 11.9* 11.1* 11.7*  HCT 38.8 36.9 33.1* 34.9*  PLT 218 195 171 194  LABPROT 14.0  --   --   --   INR 1.1  --   --   --   CREATININE 0.75 0.63 0.63 0.59    Estimated Creatinine Clearance: 36.2 mL/min (by C-G formula based on SCr of 0.59 mg/dL).   Medical History: Past Medical History:  Diagnosis Date  . A-fib (HCC)   . Bronchitis, obstructive, chronic (HCC)   . COPD exacerbation (HCC)   . Depression   . Diabetes (HCC)    type II  . Esophageal reflux disease   . Fibromyalgia   . Glaucoma   . HBP (high blood pressure)   . Hypercholesterolemia   . Hypertension   . Memory loss     Medications:  Medications Prior to Admission  Medication Sig Dispense Refill Last Dose  . acetaminophen (TYLENOL) 500 MG tablet Take 1,000 mg by mouth every 8 (eight) hours as needed for mild pain.    Past Month at prn  . albuterol (PROVENTIL HFA;VENTOLIN HFA) 108 (90 Base) MCG/ACT inhaler Inhale 2 puffs into the lungs every 6 (six) hours as needed for wheezing or shortness of breath. 1 Inhaler 0 prn at prn  . amiodarone (PACERONE) 200 MG tablet One tablet twice a day for seven days then one tablet daily 37 tablet 0   . amLODipine (NORVASC) 5 MG tablet Take 5 mg by mouth daily.     03/29/19 apixaban (ELIQUIS) 5 MG TABS tablet Take 5 mg by mouth every 12 (twelve) hours.      . Biotin 5 MG CAPS Take 1 capsule by mouth daily.     . brimonidine-timolol (COMBIGAN) 0.2-0.5 %  ophthalmic solution Place 1 drop into the right eye 2 (two) times daily.     . Calcium Carbonate Antacid 400 MG CHEW Chew 400 mg by mouth daily as needed (stomach acid).    prn at prn  . Cholecalciferol 1000 units capsule Take 1,000 Units by mouth daily.      Marland Kitchen doxycycline (VIBRA-TABS) 100 MG tablet Take 1 tablet (100 mg total) by mouth every 12 (twelve) hours. (Patient not taking: Reported on 03/24/2019) 10 tablet 0 Completed Course at Unknown time  . escitalopram (LEXAPRO) 5 MG tablet Take 1 tablet by mouth daily.  1   . latanoprost (XALATAN) 0.005 % ophthalmic solution Place 1 drop into both eyes at bedtime.     03/26/2019 losartan (COZAAR) 100 MG tablet Take 100 mg by mouth daily.     . metFORMIN (GLUCOPHAGE) 500 MG tablet Take 500 mg by mouth 2 (two) times daily with a meal.     . mometasone-formoterol (DULERA) 100-5 MCG/ACT AERO Inhale 2 puffs into the lungs 2 (two) times daily. 1 Inhaler 0   . rosuvastatin (CRESTOR) 5 MG tablet Take 5 mg by mouth daily.  3   . tiotropium (SPIRIVA HANDIHALER) 18 MCG inhalation capsule Place 1 capsule (18  mcg total) into inhaler and inhale daily. 30 capsule 0   . traZODone (DESYREL) 50 MG tablet Take 50 mg by mouth at bedtime as needed for sleep.       Scheduled:  . amiodarone  200 mg Oral Daily  . apixaban  2.5 mg Oral BID  . brimonidine  1 drop Right Eye BID   And  . timolol  1 drop Right Eye BID  . cholecalciferol  1,000 Units Oral Daily  . diltiazem  30 mg Oral Q6H  . docusate sodium  100 mg Oral BID  . escitalopram  5 mg Oral Daily  . insulin aspart  0-9 Units Subcutaneous TID WC  . Ipratropium-Albuterol  1 puff Inhalation QID  . latanoprost  1 drop Both Eyes QHS  . polyethylene glycol  17 g Oral Daily  . rosuvastatin  5 mg Oral Daily  . tiotropium  18 mcg Inhalation Daily  . tranexamic acid (CYKLOKAPRON) topical -INTRAOP  2,000 mg Topical Once   Infusions:  . lactated ringers Stopped (03/26/19 0911)  . methocarbamol (ROBAXIN) IV     PRN:  acetaminophen **OR** acetaminophen, albuterol, alum & mag hydroxide-simeth, bisacodyl, calcium carbonate, HYDROcodone-acetaminophen, magnesium citrate, magnesium hydroxide, menthol-cetylpyridinium **OR** phenol, methocarbamol **OR** methocarbamol (ROBAXIN) IV, metoCLOPramide **OR** metoCLOPramide (REGLAN) injection, metoprolol tartrate, morphine injection, morphine injection, ondansetron **OR** ondansetron (ZOFRAN) IV, oxyCODONE, traZODone Anti-infectives (From admission, onward)   Start     Dose/Rate Route Frequency Ordered Stop   03/25/19 1230  clindamycin (CLEOCIN) IVPB 600 mg     600 mg 100 mL/hr over 30 Minutes Intravenous Every 6 hours 03/25/19 1226 03/25/19 2026   03/25/19 1022  50,000 units bacitracin in 0.9% normal saline 250 mL irrigation  Status:  Discontinued       As needed 03/25/19 1024 03/25/19 1113   03/25/19 0656  clindamycin (CLEOCIN) IVPB 900 mg     900 mg 100 mL/hr over 30 Minutes Intravenous 30 min pre-op 03/25/19 3976 03/25/19 0957      Assessment: Pharmacy has been consulted to restart and monitor Apixaban dosing on 85 yo patient with nonvalvular Atrial Fibrillation. Patient was previously taking medication PTA.  Goal of Therapy:  Monitor platelets by anticoagulation protocol: Yes   Plan:  Patient was previously taking Apixaban 5mg  PO BID. The dose has now been adjusted to 2.5mg  PO BID as patient is greater than 65 years of age and weighs less than 60 kg. Hgb and platelets remain stable.  Will continue to monitor patiens Hgb and platelets with AM labs.  Varnika Butz A Antony Sian 03/27/2019,9:38 AM

## 2019-03-27 NOTE — Progress Notes (Signed)
PROGRESS NOTE  Denise Cervantes IDP:[id number] DOB: 18-Oct-1925 DOA: 03/24/2019 PCP: Dion Body, MD   LOS: 3 days   Brief Narrative / Interim history: Is a pleasant 85 year old female with history of paroxysmal A. fib, COPD, type 2 diabetes mellitus, fibromyalgia who came to the emergency room after having a fall.  She was at home trying to fix her husband's Foley catheter when she turned around and fell.  This was followed by right hip pain.  No reported syncopal events, chest pain, palpitations.  In the ED she was found to have right subcapital femoral neck fracture, orthopedic surgery was consulted  Subjective / 24h Interval events: Underwent surgery yesterday and even walked with PT last night.  She is feeling well this morning, denies any abdominal pain, nausea or vomiting.  She is constipated about get some MiraLAX.  Has some soreness at the surgical site but no significant pain  Assessment & Plan: Principal Problem Closed right hip fracture secondary to mechanical fall -Orthopedic surgery consulted, patient is status post right anterior hip hemiarthroplasty by Dr. Kurtis Bushman on 03/25/2019 -Recovering well postsurgically, PT recommended SNF however family wants to take her back to her independent living facility with 24-hour care  Active Problems Mild dementia -Complicated by postop and hospital stay delirium, discontinue all pain medications, frequent reorientation  COPD -no wheezing, no evidence of current exacerbation or issues. -Respiratory status stable today  Hypertension -Resume her home medications  A. fib with RVR -Currently in A. fib, rate controlled with amiodarone. -Developed RVR last night when agitated with rates into the 160s, requiring addition of diltiazem.  Rates improved this morning in the upper 90s  Type 2 diabetes mellitus -Continue sliding scale, hold home Metformin  CBG (last 3)  Recent Labs    03/26/19 2150 03/27/19 0736 03/27/19 1140   GLUCAP 212* 183* 241*    Depression -Continue Lexapro   Scheduled Meds: . amiodarone  200 mg Oral Daily  . apixaban  2.5 mg Oral BID  . brimonidine  1 drop Right Eye BID   And  . timolol  1 drop Right Eye BID  . cholecalciferol  1,000 Units Oral Daily  . diltiazem  60 mg Oral Q12H  . docusate sodium  100 mg Oral BID  . escitalopram  5 mg Oral Daily  . insulin aspart  0-9 Units Subcutaneous TID WC  . Ipratropium-Albuterol  1 puff Inhalation QID  . latanoprost  1 drop Both Eyes QHS  . polyethylene glycol  17 g Oral Daily  . rosuvastatin  5 mg Oral Daily  . tiotropium  18 mcg Inhalation Daily  . tranexamic acid (CYKLOKAPRON) topical -INTRAOP  2,000 mg Topical Once   Continuous Infusions:  PRN Meds:.acetaminophen **OR** acetaminophen, albuterol, alum & mag hydroxide-simeth, bisacodyl, calcium carbonate, ketorolac, magnesium citrate, magnesium hydroxide, menthol-cetylpyridinium **OR** phenol, metoCLOPramide **OR** metoCLOPramide (REGLAN) injection, metoprolol tartrate, ondansetron **OR** ondansetron (ZOFRAN) IV, traZODone  DVT prophylaxis: SCDs Code Status: Full code Family Communication: Discussed with patient's daughter at bedside Disposition Plan: SNF versus ALF tomorrow presumed controlled rates  Consultants:  Orthopedic surgery  Procedures:  None   Microbiology  SARS-CoV-2 negative  Antimicrobials: None     Objective: Vitals:   03/26/19 1827 03/26/19 2044 03/27/19 0331 03/27/19 0736  BP: (!) 153/109 138/77 127/76 (!) 182/85  Pulse: (!) 113 93 (!) 110 (!) 106  Resp: 18 18 18    Temp:  97.6 F (36.4 C) 97.8 F (36.6 C) 98.5 F (36.9 C)  TempSrc:  Oral Axillary  Oral  SpO2: 96% 96% 96% 93%  Weight:      Height:        Intake/Output Summary (Last 24 hours) at 03/27/2019 1229 Last data filed at 03/27/2019 0017 Gross per 24 hour  Intake 1730.21 ml  Output 1600 ml  Net 130.21 ml   Filed Weights   03/24/19 1855  Weight: 57.2 kg    Examination:   Constitutional: No distress, eating breakfast, quite confused this morning Eyes: No scleral icterus ENMT: Moist mucous membranes Neck: normal, supple Respiratory: Clear bilaterally, no wheezing, normal respiratory effort Cardiovascular: Irregularly irregular, tachycardic, no murmurs Abdomen: Soft, NT, ND, positive bowel sounds Musculoskeletal: no clubbing / cyanosis.  Skin: No rash seen Neurologic: No focal deficits, equal strength Psychiatric: Normal judgment and insight. Alert and oriented x self and situation   Data Reviewed: I have independently reviewed following labs and imaging studies   CBC: Recent Labs  Lab 03/24/19 2028 03/25/19 0430 03/26/19 0439 03/27/19 0429  WBC 9.3 11.8* 12.1* 12.2*  NEUTROABS 6.5  --   --   --   HGB 12.6 11.9* 11.1* 11.7*  HCT 38.8 36.9 33.1* 34.9*  MCV 93.0 93.9 89.9 89.9  PLT 218 195 171 194   Basic Metabolic Panel: Recent Labs  Lab 03/24/19 2028 03/25/19 0430 03/26/19 0439 03/27/19 0429  NA 138 134* 135 135  K 3.6 3.5 4.3 4.4  CL 104 103 104 103  CO2 26 23 23 22   GLUCOSE 145* 188* 237* 168*  BUN 17 16 17 11   CREATININE 0.75 0.63 0.63 0.59  CALCIUM 8.6* 8.0* 8.3* 8.1*   Liver Function Tests: Recent Labs  Lab 03/26/19 0439 03/27/19 0429  AST 32 43*  ALT 35 41  ALKPHOS 45 49  BILITOT 0.8 1.4*  PROT 5.9* 5.5*  ALBUMIN 2.9* 2.6*   Coagulation Profile: Recent Labs  Lab 03/24/19 2028  INR 1.1   HbA1C: Recent Labs    03/24/19 2028  HGBA1C 7.2*   CBG: Recent Labs  Lab 03/26/19 1148 03/26/19 1644 03/26/19 2150 03/27/19 0736 03/27/19 1140  GLUCAP 320* 157* 212* 183* 241*    Recent Results (from the past 240 hour(s))  SARS CORONAVIRUS 2 (TAT 6-24 HRS) Nasopharyngeal Nasopharyngeal Swab     Status: None   Collection Time: 03/24/19  8:47 PM   Specimen: Nasopharyngeal Swab  Result Value Ref Range Status   SARS Coronavirus 2 NEGATIVE NEGATIVE Final    Comment: (NOTE) SARS-CoV-2 target nucleic acids are NOT  DETECTED. The SARS-CoV-2 RNA is generally detectable in upper and lower respiratory specimens during the acute phase of infection. Negative results do not preclude SARS-CoV-2 infection, do not rule out co-infections with other pathogens, and should not be used as the sole basis for treatment or other patient management decisions. Negative results must be combined with clinical observations, patient history, and epidemiological information. The expected result is Negative. Fact Sheet for Patients: 03/29/19 Fact Sheet for Healthcare Providers: 03/26/19 This test is not yet approved or cleared by the [place] FDA and  has been authorized for detection and/or diagnosis of SARS-CoV-2 by FDA under an Emergency Use Authorization (EUA). This EUA will remain  in effect (meaning this test can be used) for the duration of the COVID-19 declaration under Section 56 4(b)(1) of the Act, 21 U.S.C. section 360bbb-3(b)(1), unless the authorization is terminated or revoked sooner. Performed at [facility], [address], [place], [address] [place]   Surgical pcr screen     Status: None  Collection Time: 03/24/19 11:58 PM   Specimen: Nasal Mucosa; Nasal Swab  Result Value Ref Range Status   MRSA, PCR NEGATIVE NEGATIVE Final   Staphylococcus aureus NEGATIVE NEGATIVE Final    Comment: (NOTE) The Xpert SA Assay (FDA approved for NASAL specimens in patients 83 years of age and older), is one component of a comprehensive surveillance program. It is not intended to diagnose infection nor to guide or monitor treatment. Performed at [facility], [address] [place], [place] [address]      Radiology Studies: No results found. Pamella Pertostin Gherghe, MD, PhD [facility]  Between 7 am - 7 pm I am available, please contact me via Amion or Securechat  Between 7 pm - 7 am I am not available, please contact  night coverage MD/APP via Amion

## 2019-03-27 NOTE — TOC Progression Note (Signed)
Transition of Care [facility]) - Progression Note    Patient Details  Name: Denise Cervantes MRN: [id number] Date of Birth: 07/02/1925  Transition of Care [facility]) CM/SW Contact  Shelbie Hutching, RN Phone Number: 03/27/2019, 1:57 PM  Clinical Narrative:    Patient will not discharge today, patient's daughter reports that the patient is very confused and no where near baseline.  Transport for [employer] canceled for today.  RNCM will cont to follow patient, hopefully patient will have a better night tonight and be medically ready for discharge tomorrow.  If patient is not able to go home the daughter is open to looking at SNF at [facility].     Expected Discharge Plan: OP Rehab(Independent Living) Barriers to Discharge: Continued Medical Work up  Expected Discharge Plan and Services Expected Discharge Plan: OP Rehab(Independent Living)   Discharge Planning Services: CM Consult   Living arrangements for the past 2 months: [place]                                       Social Determinants of Health (SDOH) Interventions    Readmission Risk Interventions No flowsheet data found.

## 2019-03-28 DIAGNOSIS — R410 Disorientation, unspecified: Secondary | ICD-10-CM

## 2019-03-28 LAB — GLUCOSE, CAPILLARY
Glucose-Capillary: 184 mg/dL — ABNORMAL HIGH (ref 70–99)
Glucose-Capillary: 294 mg/dL — ABNORMAL HIGH (ref 70–99)

## 2019-03-28 MED ORDER — APIXABAN 2.5 MG PO TABS: 2.5000 mg | ORAL_TABLET | Freq: Two times a day (BID) | ORAL | 1 refills | Status: AC

## 2019-03-28 MED ORDER — DILTIAZEM HCL ER COATED BEADS 120 MG PO CP24: 120.0000 mg | ORAL_CAPSULE | Freq: Every day | ORAL | 1 refills | Status: AC

## 2019-03-28 NOTE — Discharge Instructions (Signed)
Follow with Denise Ivan, MD in 5-7 days  A medication called diltiazem was added to her regimen for your atrial fibrillation to prevent your heart to beat too fast  Your Eliquis has been adjusted based on the pharmacy recommendations for age/weight  Please get a complete blood count and chemistry panel checked by your Primary MD at your next visit, and again as instructed by your Primary MD. Please get your medications reviewed and adjusted by your Primary MD.  Please request your Primary MD to go over all Hospital Tests and Procedure/Radiological results at the follow up, please get all Hospital records sent to your Prim MD by signing hospital release before you go home.  In some cases, there will be blood work, cultures and biopsy results pending at the time of your discharge. Please request that your primary care M.D. goes through all the records of your hospital data and follows up on these results.  If you had Pneumonia of Lung problems at the Hospital: Please get a 2 view Chest X ray done in 6-8 weeks after hospital discharge or sooner if instructed by your Primary MD.  If you have Congestive Heart Failure: Please call your Cardiologist or Primary MD anytime you have any of the following symptoms:  1) 3 pound weight gain in 24 hours or 5 pounds in 1 week  2) shortness of breath, with or without a dry hacking cough  3) swelling in the hands, feet or stomach  4) if you have to sleep on extra pillows at night in order to breathe  Follow cardiac low salt diet and 1.5 lit/day fluid restriction.  If you have diabetes Accuchecks 4 times/day, Once in AM empty stomach and then before each meal. Log in all results and show them to your primary doctor at your next visit. If any glucose reading is under 80 or above 300 call your primary MD immediately.  If you have Seizure/Convulsions/Epilepsy: Please do not drive, operate heavy machinery, participate in activities at heights or  participate in high speed sports until you have seen by Primary MD or a Neurologist and advised to do so again. Per [facility] statutes, patients with seizures are not allowed to drive until they have been seizure-free for six months.  Use caution when using heavy equipment or power tools. Avoid working on ladders or at heights. Take showers instead of baths. Ensure the water temperature is not too high on the home water heater. Do not go swimming alone. Do not lock yourself in a room alone (i.e. bathroom). When caring for infants or small children, sit down when holding, feeding, or changing them to minimize risk of injury to the child in the event you have a seizure. Maintain good sleep hygiene. Avoid alcohol.   If you had Gastrointestinal Bleeding: Please ask your Primary MD to check a complete blood count within one week of discharge or at your next visit. Your endoscopic/colonoscopic biopsies that are pending at the time of discharge, will also need to followed by your Primary MD.  Get Medicines reviewed and adjusted. Please take all your medications with you for your next visit with your Primary MD  Please request your Primary MD to go over all hospital tests and procedure/radiological results at the follow up, please ask your Primary MD to get all Hospital records sent to his/her office.  If you experience worsening of your admission symptoms, develop shortness of breath, life threatening emergency, suicidal or homicidal thoughts you must seek medical  attention immediately by calling 911 or calling your MD immediately  if symptoms less severe.  You must read complete instructions/literature along with all the possible adverse reactions/side effects for all the Medicines you take and that have been prescribed to you. Take any new Medicines after you have completely understood and accpet all the possible adverse reactions/side effects.   Do not drive or operate heavy machinery when  taking Pain medications.   Do not take more than prescribed Pain, Sleep and Anxiety Medications  Special Instructions: If you have smoked or chewed Tobacco  in the last 2 yrs please stop smoking, stop any regular Alcohol  and or any Recreational drug use.  Wear Seat belts while driving.  Please note You were cared for by a hospitalist during your hospital stay. If you have any questions about your discharge medications or the care you received while you were in the hospital after you are discharged, you can call the unit and asked to speak with the hospitalist on call if the hospitalist that took care of you is not available. Once you are discharged, your primary care physician will handle any further medical issues. Please note that NO REFILLS for any discharge medications will be authorized once you are discharged, as it is imperative that you return to your primary care physician (or establish a relationship with a primary care physician if you do not have one) for your aftercare needs so that they can reassess your need for medications and monitor your lab values.  You can reach the hospitalist office at phone [phone] or fax [phone]   If you do not have a primary care physician, you can call [phone] for a physician referral.  Activity: As tolerated with Full fall precautions use walker/cane & assistance as needed    Diet: regular  Disposition Home

## 2019-03-28 NOTE — Progress Notes (Signed)
Physical Therapy Treatment Patient Details Name: Denise Cervantes MRN: [id number] DOB: [birth year] Today's Date: 03/28/2019    History of Present Illness Pt to ED post R hip hemiarthroplasty after fall tripping on husbands foley catheter while she was attempting to change it. 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 Patient endorses multiple falls/trips in the past 21mos. PMH DM2, afib, COPD, fibromyalgia, and GERD.    PT Comments    Patient in bed, attempting to eat breakfast in supine. PT asked pt if she would like to transfer out of bed prior to eating breakfast to decrease aspiration risk, pt agreeable. The patient demonstrated significant improvement compared to last session with mobility. Bed mobility minA for complete trunk elevation, and pt with ability to shift weight towards EOB with CGA. Sit <> stand with RW and minA. The patient was able to ambulate ~92ft with RW and CGA to recliner in room, step by step verbal cues provided for safety/proper sequencing. The patient did also perform several exercises during session as well with improved muscle activation. Pt up in chair eager to eat breakfast, further mobility held. The patient would benefit from further skilled PT intervention to continue to progress towards goals.     Follow Up Recommendations  SNF     Equipment Recommendations  Rolling walker with 5" wheels    Recommendations for Other Services       Precautions / Restrictions Precautions Precautions: Anterior Hip;Fall Restrictions Weight Bearing Restrictions: Yes RLE Weight Bearing: Weight bearing as tolerated    Mobility  Bed Mobility Overal bed mobility: Needs Assistance Bed Mobility: Supine to Sit     Supine to sit: Min assist;HOB elevated        Transfers Overall transfer level: Needs assistance Equipment used: Rolling walker (2 wheeled) Transfers:  Sit to/from Stand Sit to Stand: Min assist         General transfer comment: pt with improved mobility  Ambulation/Gait Ambulation/Gait assistance: Min guard Gait Distance (Feet): 3 Feet Assistive device: Rolling walker (2 wheeled)       General Gait Details: Pt with improved ability for weight bearing, upright balance, and R foot clearance compared to previous session. Limited mobility due to pt requesting to eat breakfast at this time.   Stairs             Wheelchair Mobility    Modified Rankin (Stroke Patients Only)       Balance Overall balance assessment: Needs assistance Sitting-balance support: Feet supported Sitting balance-Leahy Scale: Fair Sitting balance - Comments: Pt needed minA initially, did progress to CGA/supervision     Standing balance-Leahy Scale: Zero                              Cognition Arousal/Alertness: Awake/alert Behavior During Therapy: WFL for tasks assessed/performed Overall Cognitive Status: Within Functional Limits for tasks assessed                                        Exercises Total Joint Exercises Quad Sets: AROM;Supine;10 reps;Left Heel Slides: AROM;Left;10 reps Straight Leg Raises: AAROM;Strengthening;Left;10 reps Long Arc Quad: AROM;Strengthening;10 reps;Left Marching in Standing: AROM;Seated;Both;10 reps;Strengthening    General Comments        Pertinent Vitals/Pain Pain Assessment: No/denies pain  Home Living                      Prior Function            PT Goals (current goals can now be found in the care plan section) Progress towards PT goals: Progressing toward goals    Frequency    BID      PT Plan Current plan remains appropriate    Co-evaluation              AM-PAC PT "6 Clicks" Mobility   Outcome Measure  Help needed turning from your back to your side while in a flat bed without using bedrails?: A Lot Help needed moving from lying  on your back to sitting on the side of a flat bed without using bedrails?: A Lot Help needed moving to and from a bed to a chair (including a wheelchair)?: A Little Help needed standing up from a chair using your arms (e.g., wheelchair or bedside chair)?: A Little Help needed to walk in hospital room?: A Little Help needed climbing 3-5 steps with a railing? : Total 6 Click Score: 14    End of Session Equipment Utilized During Treatment: Gait belt Activity Tolerance: Patient tolerated treatment well Patient left: with chair alarm set;in chair;with call bell/phone within reach Nurse Communication: Mobility status PT Visit Diagnosis: Repeated falls (R29.6);Difficulty in walking, not elsewhere classified (R26.2);Other abnormalities of gait and mobility (R26.89);Unsteadiness on feet (R26.81);Muscle weakness (generalized) (M62.81)     Time: 1610-9604 PT Time Calculation (min) (ACUTE ONLY): 13 min  Charges:  $Therapeutic Exercise: 8-22 mins                     Lieutenant Diego PT, DPT 10:11 AM,03/28/19 [phone]

## 2019-03-28 NOTE — Discharge Summary (Addendum)
Physician Discharge Summary  Denise Cervantes UJW:[id number] DOB: 1925/10/07 DOA: 03/24/2019  PCP: Marisue IvanLinthavong, [place], MD  Admit date: 03/24/2019 Discharge date: 03/28/2019  Admitted From: home Disposition:  home  Recommendations for Outpatient Follow-up:  1. Follow up with PCP in 1-2 weeks 2. Follow-up with orthopedic surgery as recommended 3. Outpatient PT/OT  Home Health: outpatient PT and OT Equipment/Devices: Walker  Discharge Condition: Stable CODE STATUS: DNR Diet recommendation: Regular  HPI: Per admitting MD, Denise BrickKatie Cervantes  is a 85 y.o. Caucasian female with a known history of paroxysmal atrial fibrillation, COPD, type 2 diabetes mellitus, fibromyalgia and GERD, who presented to the emergency room with onset of accidental mechanical fall while she was trying to fix her husband's Foley catheter when she turned around and her feet get tangled per her report and lost her balance falling on her right side.  She had subsequent right hip pain.  No presyncope or syncope.  No dizziness or blurred vision.  No chest pain or dyspnea or palpitations.  She had mild headache without head injuries. Upon presentation to the emergency room, blood pressure was 179/99 with otherwise normal vital signs.  Labs revealed potassium of 3.6 and blood glucose of 145 with unremarkable CBC.  Her COVID-19 test is currently pending Her right knee x-ray showed chronic degenerative changes without acute abnormalities.  Hip x-rays showed right subcapital femoral neck fracture.  EKG showed atrial fibrillation with controlled rate response of 71 with PVCs. The patient was given 4 mg of IV morphine sulfate twice as well as 4 mg of IV Zofran.  Dr. Odis LusterBowers was notified about the patient is aware.  She will be admitted to surgical bed for further evaluation and management.  Hospital Course / Discharge diagnoses: Principal Problem Closed right hip fracture secondary to mechanical fall -Orthopedic surgery consulted and  followed patient while hospitalized, patient is status post right anterior hip hemiarthroplasty by Dr. Cassell SmilesJames Bowers on 03/25/2019.  She recovered well post operatively, physical therapy evaluated and worked with the patient, recommending SNF.  Patient lives in assisted living along with her husband and per family they prefer patient to return home with home health.  Therapies at home have been maximized.  She is already anticoagulated with Eliquis for her A. fib.  Active Problems Mild dementia with postop delirium -Complicated by postop and hospital stay delirium, all her pain medications were discontinued, frequent reorientation, with supportive measures she has improved and has returned close to baseline, confirmed by the family.  Avoid narcotics in the future. COPD -no wheezing, no evidence of current exacerbation or issues. Hypertension -Resume her home medications, added diltiazem as above A. fib with RVR -Currently in A. Fib, already on amiodarone however she had few episodes of RVR requiring addition of diltiazem.  Rates are improved, she is asymptomatic.  Outpatient follow-up with PCP/cardiology Type 2 diabetes mellitus -resume home medications Depression -Continue Lexapro   Discharge Instructions   Allergies as of 03/28/2019      Reactions   Penicillins       Medication List    STOP taking these medications   doxycycline 100 MG tablet Commonly known as: VIBRA-TABS     TAKE these medications   acetaminophen 500 MG tablet Commonly known as: TYLENOL Take 1,000 mg by mouth every 8 (eight) hours as needed for mild pain.   albuterol 108 (90 Base) MCG/ACT inhaler Commonly known as: VENTOLIN HFA Inhale 2 puffs into the lungs every 6 (six) hours as needed for wheezing or shortness  of breath.   amiodarone 200 MG tablet Commonly known as: PACERONE One tablet twice a day for seven days then one tablet daily   amLODipine 5 MG tablet Commonly known as: NORVASC Take 5 mg by mouth  daily.   apixaban 2.5 MG Tabs tablet Commonly known as: ELIQUIS Take 1 tablet (2.5 mg total) by mouth 2 (two) times daily. What changed:   medication strength  how much to take  when to take this   Biotin 5 MG Caps Take 1 capsule by mouth daily.   Calcium Carbonate Antacid 400 MG Chew Chew 400 mg by mouth daily as needed (stomach acid).   Cholecalciferol 25 MCG (1000 UT) capsule Take 1,000 Units by mouth daily.   Combigan 0.2-0.5 % ophthalmic solution Generic drug: brimonidine-timolol Place 1 drop into the right eye 2 (two) times daily.   diltiazem 120 MG 24 hr capsule Commonly known as: Cardizem CD Take 1 capsule (120 mg total) by mouth daily.   escitalopram 5 MG tablet Commonly known as: LEXAPRO Take 1 tablet by mouth daily.   latanoprost 0.005 % ophthalmic solution Commonly known as: XALATAN Place 1 drop into both eyes at bedtime.   losartan 100 MG tablet Commonly known as: COZAAR Take 100 mg by mouth daily.   metFORMIN 500 MG tablet Commonly known as: GLUCOPHAGE Take 500 mg by mouth 2 (two) times daily with a meal.   mometasone-formoterol 100-5 MCG/ACT Aero Commonly known as: Dulera Inhale 2 puffs into the lungs 2 (two) times daily.   rosuvastatin 5 MG tablet Commonly known as: CRESTOR Take 5 mg by mouth daily.   tiotropium 18 MCG inhalation capsule Commonly known as: Spiriva HandiHaler Place 1 capsule (18 mcg total) into inhaler and inhale daily.   traZODone 50 MG tablet Commonly known as: DESYREL Take 50 mg by mouth at bedtime as needed for sleep.      Follow-up Information    Marisue Ivan, MD Follow up in 1 week(s).   Specialty: Family Medicine Contact information: [address] [facility] [place] [address] [phone]           Consultations:  Orthopedic surgery  Procedures/Studies:  Right hip arthroplasty  Ct Head Wo Contrast  Result Date: 03/24/2019 CLINICAL DATA:  Recent fall EXAM: CT HEAD  WITHOUT CONTRAST CT CERVICAL SPINE WITHOUT CONTRAST TECHNIQUE: Multidetector CT imaging of the head and cervical spine was performed following the standard protocol without intravenous contrast. Multiplanar CT image reconstructions of the cervical spine were also generated. COMPARISON:  11/15/2017 FINDINGS: CT HEAD FINDINGS Brain: Chronic atrophic and ischemic changes are again identified. No findings to suggest acute hemorrhage, acute infarction or space-occupying mass lesion are noted. Vascular: No hyperdense vessel or unexpected calcification. Skull: Normal. Negative for fracture or focal lesion. Sinuses/Orbits: No acute finding. Other: None. CT CERVICAL SPINE FINDINGS Alignment: Mild loss of the normal cervical lordosis is noted which appears degenerative in nature. Skull base and vertebrae: 7 cervical segments are well visualized. Disc space narrowing at C5-6 and C6-7 is seen with associated osteophytic changes. Posterior fusion defect at C1 is noted. No acute fracture or acute facet abnormality is noted. Multilevel facet hypertrophic changes are seen. Soft tissues and spinal canal: Surrounding soft tissue structures show vascular calcifications as well as multiple hypodense nodules within the thyroid. These appear similar to that seen on a prior carotid ultrasound. Upper chest: Visualized lung apices are within normal limits. Other: None IMPRESSION: CT of the head: Chronic atrophic and ischemic changes without  acute abnormality. CT of the cervical spine: Multilevel degenerative change without acute abnormality. Multiple thyroid nodules are noted stable from a prior carotid ultrasound from 2019. Given the patient's age no further follow-up is recommended. Electronically Signed   By: Alcide Clever M.D.   On: 03/24/2019 20:33   Ct Cervical Spine Wo Contrast  Result Date: 03/24/2019 CLINICAL DATA:  Recent fall EXAM: CT HEAD WITHOUT CONTRAST CT CERVICAL SPINE WITHOUT CONTRAST TECHNIQUE: Multidetector CT imaging  of the head and cervical spine was performed following the standard protocol without intravenous contrast. Multiplanar CT image reconstructions of the cervical spine were also generated. COMPARISON:  11/15/2017 FINDINGS: CT HEAD FINDINGS Brain: Chronic atrophic and ischemic changes are again identified. No findings to suggest acute hemorrhage, acute infarction or space-occupying mass lesion are noted. Vascular: No hyperdense vessel or unexpected calcification. Skull: Normal. Negative for fracture or focal lesion. Sinuses/Orbits: No acute finding. Other: None. CT CERVICAL SPINE FINDINGS Alignment: Mild loss of the normal cervical lordosis is noted which appears degenerative in nature. Skull base and vertebrae: 7 cervical segments are well visualized. Disc space narrowing at C5-6 and C6-7 is seen with associated osteophytic changes. Posterior fusion defect at C1 is noted. No acute fracture or acute facet abnormality is noted. Multilevel facet hypertrophic changes are seen. Soft tissues and spinal canal: Surrounding soft tissue structures show vascular calcifications as well as multiple hypodense nodules within the thyroid. These appear similar to that seen on a prior carotid ultrasound. Upper chest: Visualized lung apices are within normal limits. Other: None IMPRESSION: CT of the head: Chronic atrophic and ischemic changes without acute abnormality. CT of the cervical spine: Multilevel degenerative change without acute abnormality. Multiple thyroid nodules are noted stable from a prior carotid ultrasound from 2019. Given the patient's age no further follow-up is recommended. Electronically Signed   By: Alcide Clever M.D.   On: 03/24/2019 20:33   Dg Knee Complete 4 Views Right  Result Date: 03/24/2019 CLINICAL DATA:  Recent fall with right knee pain, initial encounter EXAM: RIGHT KNEE - COMPLETE 4+ VIEW COMPARISON:  None. FINDINGS: Degenerative changes are noted in the medial joint space with some irregularity of  the medial femoral condyle consistent with a osteochondral defect. No joint effusion is seen. No acute fracture or dislocation is noted. IMPRESSION: Chronic degenerative changes without acute abnormality. Electronically Signed   By: Alcide Clever M.D.   On: 03/24/2019 20:09   Dg Hip Operative Unilat With Pelvis Right  Result Date: 03/25/2019 CLINICAL DATA:  Right hip hemiarthroplasty EXAM: OPERATIVE right HIP (WITH PELVIS IF PERFORMED) 1 VIEWS TECHNIQUE: Fluoroscopic spot image(s) were submitted for interpretation post-operatively. COMPARISON:  03/24/2019 FINDINGS: Frontal projection of the right hip demonstrates hemiarthroplasty in place without complicating feature. IMPRESSION: 1. Right hip hemiarthroplasty in place without visible complicating feature. Electronically Signed   By: Gaylyn Rong M.D.   On: 03/25/2019 17:18   Dg Hip Unilat W Or Wo Pelvis 2-3 Views Right  Result Date: 03/24/2019 CLINICAL DATA:  Recent fall with hip pain, initial encounter EXAM: DG HIP (WITH OR WITHOUT PELVIS) 2-3V RIGHT COMPARISON:  None. FINDINGS: Pelvic ring is intact. Degenerative changes of lumbar spine are noted. Subcapital femoral neck fracture is noted with impaction at the fracture site. No focal abnormality is noted. IMPRESSION: Right subcapital femoral neck fracture. Electronically Signed   By: Alcide Clever M.D.   On: 03/24/2019 20:10      Subjective: - no chest pain, shortness of breath, no abdominal pain, nausea or vomiting.  Discharge Exam: BP (!) 159/73 (BP Location: Left Arm)   Pulse 89   Temp 97.7 F (36.5 C) (Oral)   Resp 16   Ht 5\' 2"  (1.575 m)   Wt 57.2 kg   SpO2 95%   BMI 23.05 kg/m   General: Pt is alert, awake, not in acute distress Cardiovascular: irregular Respiratory: CTA bilaterally, no wheezing, no rhonchi Abdominal: Soft, NT, ND, bowel sounds + Extremities: no edema, no cyanosis    The results of significant diagnostics from this hospitalization (including  imaging, microbiology, ancillary and laboratory) are listed below for reference.     Microbiology: Recent Results (from the past 240 hour(s))  SARS CORONAVIRUS 2 (TAT 6-24 HRS) Nasopharyngeal Nasopharyngeal Swab     Status: None   Collection Time: 03/24/19  8:47 PM   Specimen: Nasopharyngeal Swab  Result Value Ref Range Status   SARS Coronavirus 2 NEGATIVE NEGATIVE Final    Comment: (NOTE) SARS-CoV-2 target nucleic acids are NOT DETECTED. The SARS-CoV-2 RNA is generally detectable in upper and lower respiratory specimens during the acute phase of infection. Negative results do not preclude SARS-CoV-2 infection, do not rule out co-infections with other pathogens, and should not be used as the sole basis for treatment or other patient management decisions. Negative results must be combined with clinical observations, patient history, and epidemiological information. The expected result is Negative. Fact Sheet for Patients: SugarRoll.be Fact Sheet for Healthcare Providers: https://www.woods-mathews.com/ This test is not yet approved or cleared by the [place] FDA and  has been authorized for detection and/or diagnosis of SARS-CoV-2 by FDA under an Emergency Use Authorization (EUA). This EUA will remain  in effect (meaning this test can be used) for the duration of the COVID-19 declaration under Section 56 4(b)(1) of the Act, 21 U.S.C. section 360bbb-3(b)(1), unless the authorization is terminated or revoked sooner. Performed at [facility], [place] [address] [place], [place] [address]   Surgical pcr screen     Status: None   Collection Time: 03/24/19 11:58 PM   Specimen: Nasal Mucosa; Nasal Swab  Result Value Ref Range Status   MRSA, PCR NEGATIVE NEGATIVE Final   Staphylococcus aureus NEGATIVE NEGATIVE Final    Comment: (NOTE) The Xpert SA Assay (FDA approved for NASAL specimens in patients 63 years of age and older), is one  component of a comprehensive surveillance program. It is not intended to diagnose infection nor to guide or monitor treatment. Performed at [facility], [address] [place], [place] [address]      Labs: Basic Metabolic Panel: Recent Labs  Lab 03/24/19 2028 03/25/19 0430 03/26/19 0439 03/27/19 0429  NA 138 134* 135 135  K 3.6 3.5 4.3 4.4  CL 104 103 104 103  CO2 26 23 23 22   GLUCOSE 145* 188* 237* 168*  BUN 17 16 17 11   CREATININE 0.75 0.63 0.63 0.59  CALCIUM 8.6* 8.0* 8.3* 8.1*   Liver Function Tests: Recent Labs  Lab 03/26/19 0439 03/27/19 0429  AST 32 43*  ALT 35 41  ALKPHOS 45 49  BILITOT 0.8 1.4*  PROT 5.9* 5.5*  ALBUMIN 2.9* 2.6*   CBC: Recent Labs  Lab 03/24/19 2028 03/25/19 0430 03/26/19 0439 03/27/19 0429  WBC 9.3 11.8* 12.1* 12.2*  NEUTROABS 6.5  --   --   --   HGB 12.6 11.9* 11.1* 11.7*  HCT 38.8 36.9 33.1* 34.9*  MCV 93.0 93.9 89.9 89.9  PLT 218 195 171 194   CBG: Recent Labs  Lab 03/26/19 2150 03/27/19 0736 03/27/19 1140 03/27/19 1701 03/27/19 2123  GLUCAP 212* 183* 241* 199* 214*   Hgb A1c No results for input(s): HGBA1C in the last 72 hours. Lipid Profile No results for input(s): CHOL, HDL, LDLCALC, TRIG, CHOLHDL, LDLDIRECT in the last 72 hours. Thyroid function studies No results for input(s): TSH, T4TOTAL, T3FREE, THYROIDAB in the last 72 hours.  Invalid input(s): FREET3 Urinalysis    Component Value Date/Time   COLORURINE YELLOW (A) 09/02/2017 1122   APPEARANCEUR CLEAR (A) 09/02/2017 1122   LABSPEC 1.014 09/02/2017 1122   PHURINE 7.0 09/02/2017 1122   GLUCOSEU NEGATIVE 09/02/2017 1122   HGBUR NEGATIVE 09/02/2017 1122   BILIRUBINUR NEGATIVE 09/02/2017 1122   KETONESUR NEGATIVE 09/02/2017 1122   PROTEINUR 100 (A) 09/02/2017 1122   NITRITE NEGATIVE 09/02/2017 1122   LEUKOCYTESUR NEGATIVE 09/02/2017 1122    FURTHER DISCHARGE INSTRUCTIONS:   Get Medicines reviewed and adjusted: Please take all your  medications with you for your next visit with your Primary MD   Laboratory/radiological data: Please request your Primary MD to go over all hospital tests and procedure/radiological results at the follow up, please ask your Primary MD to get all Hospital records sent to his/her office.   In some cases, they will be blood work, cultures and biopsy results pending at the time of your discharge. Please request that your primary care M.D. goes through all the records of your hospital data and follows up on these results.   Also Note the following: If you experience worsening of your admission symptoms, develop shortness of breath, life threatening emergency, suicidal or homicidal thoughts you must seek medical attention immediately by calling 911 or calling your MD immediately  if symptoms less severe.   You must read complete instructions/literature along with all the possible adverse reactions/side effects for all the Medicines you take and that have been prescribed to you. Take any new Medicines after you have completely understood and accpet all the possible adverse reactions/side effects.    Do not drive when taking Pain medications or sleeping medications (Benzodaizepines)   Do not take more than prescribed Pain, Sleep and Anxiety Medications. It is not advisable to combine anxiety,sleep and pain medications without talking with your primary care practitioner   Special Instructions: If you have smoked or chewed Tobacco  in the last 2 yrs please stop smoking, stop any regular Alcohol  and or any Recreational drug use.   Wear Seat belts while driving.   Please note: You were cared for by a hospitalist during your hospital stay. Once you are discharged, your primary care physician will handle any further medical issues. Please note that NO REFILLS for any discharge medications will be authorized once you are discharged, as it is imperative that you return to your primary care physician (or  establish a relationship with a primary care physician if you do not have one) for your post hospital discharge needs so that they can reassess your need for medications and monitor your lab values.  Time coordinating discharge: 35 minutes  SIGNED:  Pamella Pert, MD, PhD 03/28/2019, 9:41 AM

## 2019-03-28 NOTE — Progress Notes (Signed)
Discharge summary reviewed with daughter [name]. Answered all questions. Transportation arrangements will be made with [facility] once equipment arrives.

## 2019-03-28 NOTE — TOC Transition Note (Signed)
Transition of Care [facility]) - CM/SW Discharge Note   Patient Details  Name: LAURIE PENADO MRN: [id number] Date of Birth: Sep 15, 1925  [facility]) CM/SW Contact:  Shelbie Hutching, RN Phone Number: 03/28/2019, 10:12 AM   Clinical Narrative:    Patient is ready for discharge back to [facility].  Patient will discharge with outpatient PT and OT that will be provided by [facility].  Patient's daughter will arranged transportation with [facility].  [facility] will pick patient up in wheelchair accessible vehicle.  Daughter reports that patient will have 24/7 care with [facility].   Patient's daughter requested rolling walker, patient has a rollator at home.  Rolling walker to be brought up to patient's room, provided by Sunday Corn with [employer].   Final next level of care: Home/Self Care(OP PT and OT) Barriers to Discharge: Barriers Resolved   Patient Goals and CMS Choice Patient states their goals for this hospitalization and ongoing recovery are:: Wants to go home with outpatient PT and OT at [email or web address].gov Compare Post Acute Care list provided to:: Patient Choice offered to / list presented to : Patient  Discharge Placement                Patient to be transferred to facility by: [facility] transport Name of family member notified: [name] Patient and family notified of of transfer: 03/28/19  Discharge Plan and Services   Discharge Planning Services: CM Consult            DME Arranged: Gilford Rile rolling DME Agency: [employer] Date DME Agency Contacted: 03/28/19 Time DME Agency Contacted: [phone] Representative spoke with at DME Agency: [place] Determinants of Health ([place]) Interventions     Readmission Risk Interventions No flowsheet data found.

## 2019-03-28 NOTE — TOC Transition Note (Signed)
Transition of Care [facility]) - CM/SW Discharge Note   Patient Details  Name: Denise Cervantes MRN: [id number] Date of Birth: June 30, 1925  Transition of Care [facility]) CM/SW Contact:  Shelbie Hutching, RN Phone Number: 03/28/2019, 12:40 PM   Clinical Narrative:    3 in 1 and rolling walker delivered to room.  Daughter has arranged transportation.  Daughter [name] will call the nurses station or the [facility] driver will call when he arrives.     Final next level of care: Home/Self Care(OP PT and OT) Barriers to Discharge: Barriers Resolved   Patient Goals and CMS Choice Patient states their goals for this hospitalization and ongoing recovery are:: Wants to go home with outpatient PT and OT at [email or web address] Compare Post Acute Care list provided to:: Patient Choice offered to / list presented to : Patient  Discharge Placement                Patient to be transferred to facility by: [facility] transport Name of family member notified: [name] Patient and family notified of of transfer: 03/28/19  Discharge Plan and Services   Discharge Planning Services: CM Consult            DME Arranged: Gilford Rile rolling DME Agency: [employer] Date DME Agency Contacted: 03/28/19 Time DME Agency Contacted: [phone] Representative spoke with at DME Agency: [place] Determinants of Health ([place]) Interventions     Readmission Risk Interventions No flowsheet data found.

## 2019-05-05 ENCOUNTER — Encounter: Payer: Self-pay | Admitting: Intensive Care

## 2019-05-05 ENCOUNTER — Emergency Department: Payer: Medicare Other | Admitting: Certified Registered"

## 2019-05-05 ENCOUNTER — Encounter: Admission: EM | Disposition: E | Payer: Self-pay | Source: Home / Self Care | Attending: Internal Medicine

## 2019-05-05 ENCOUNTER — Emergency Department: Payer: Medicare Other

## 2019-05-05 ENCOUNTER — Other Ambulatory Visit: Payer: Self-pay

## 2019-05-05 ENCOUNTER — Inpatient Hospital Stay
Admission: EM | Admit: 2019-05-05 | Discharge: 2019-05-11 | DRG: 268 | Disposition: E | Payer: Medicare Other | Attending: Internal Medicine | Admitting: Internal Medicine

## 2019-05-05 ENCOUNTER — Inpatient Hospital Stay: Payer: Medicare Other

## 2019-05-05 DIAGNOSIS — Z515 Encounter for palliative care: Secondary | ICD-10-CM | POA: Diagnosis not present

## 2019-05-05 DIAGNOSIS — Z7984 Long term (current) use of oral hypoglycemic drugs: Secondary | ICD-10-CM | POA: Diagnosis not present

## 2019-05-05 DIAGNOSIS — H409 Unspecified glaucoma: Secondary | ICD-10-CM | POA: Diagnosis present

## 2019-05-05 DIAGNOSIS — I4891 Unspecified atrial fibrillation: Secondary | ICD-10-CM | POA: Diagnosis present

## 2019-05-05 DIAGNOSIS — E872 Acidosis: Secondary | ICD-10-CM | POA: Diagnosis not present

## 2019-05-05 DIAGNOSIS — K219 Gastro-esophageal reflux disease without esophagitis: Secondary | ICD-10-CM | POA: Diagnosis present

## 2019-05-05 DIAGNOSIS — Z87891 Personal history of nicotine dependence: Secondary | ICD-10-CM

## 2019-05-05 DIAGNOSIS — I713 Abdominal aortic aneurysm, ruptured, unspecified: Secondary | ICD-10-CM | POA: Diagnosis present

## 2019-05-05 DIAGNOSIS — F329 Major depressive disorder, single episode, unspecified: Secondary | ICD-10-CM | POA: Diagnosis present

## 2019-05-05 DIAGNOSIS — J9601 Acute respiratory failure with hypoxia: Secondary | ICD-10-CM | POA: Diagnosis not present

## 2019-05-05 DIAGNOSIS — Z66 Do not resuscitate: Secondary | ICD-10-CM | POA: Diagnosis present

## 2019-05-05 DIAGNOSIS — I10 Essential (primary) hypertension: Secondary | ICD-10-CM | POA: Diagnosis present

## 2019-05-05 DIAGNOSIS — E785 Hyperlipidemia, unspecified: Secondary | ICD-10-CM | POA: Diagnosis present

## 2019-05-05 DIAGNOSIS — Z8619 Personal history of other infectious and parasitic diseases: Secondary | ICD-10-CM

## 2019-05-05 DIAGNOSIS — Z880 Allergy status to penicillin: Secondary | ICD-10-CM

## 2019-05-05 DIAGNOSIS — R570 Cardiogenic shock: Secondary | ICD-10-CM | POA: Diagnosis not present

## 2019-05-05 DIAGNOSIS — Z7951 Long term (current) use of inhaled steroids: Secondary | ICD-10-CM

## 2019-05-05 DIAGNOSIS — E119 Type 2 diabetes mellitus without complications: Secondary | ICD-10-CM | POA: Diagnosis present

## 2019-05-05 DIAGNOSIS — Z7901 Long term (current) use of anticoagulants: Secondary | ICD-10-CM

## 2019-05-05 DIAGNOSIS — J95821 Acute postprocedural respiratory failure: Secondary | ICD-10-CM | POA: Diagnosis not present

## 2019-05-05 DIAGNOSIS — Z79899 Other long term (current) drug therapy: Secondary | ICD-10-CM | POA: Diagnosis not present

## 2019-05-05 DIAGNOSIS — M797 Fibromyalgia: Secondary | ICD-10-CM | POA: Diagnosis present

## 2019-05-05 DIAGNOSIS — Z96641 Presence of right artificial hip joint: Secondary | ICD-10-CM | POA: Diagnosis present

## 2019-05-05 HISTORY — PX: ABDOMINAL AORTIC ANEURYSM REPAIR: SHX42

## 2019-05-05 HISTORY — PX: ENDOVASCULAR REPAIR/STENT GRAFT: CATH118280

## 2019-05-05 LAB — RESPIRATORY PANEL BY RT PCR (FLU A&B, COVID)
Influenza A by PCR: NEGATIVE
Influenza B by PCR: NEGATIVE
SARS Coronavirus 2 by RT PCR: POSITIVE — AB

## 2019-05-05 LAB — CBC WITH DIFFERENTIAL/PLATELET
Abs Immature Granulocytes: 0.25 10*3/uL — ABNORMAL HIGH (ref 0.00–0.07)
Basophils Absolute: 0 10*3/uL (ref 0.0–0.1)
Basophils Relative: 0 %
Eosinophils Absolute: 0 10*3/uL (ref 0.0–0.5)
Eosinophils Relative: 0 %
HCT: 27.9 % — ABNORMAL LOW (ref 36.0–46.0)
Hemoglobin: 9.4 g/dL — ABNORMAL LOW (ref 12.0–15.0)
Immature Granulocytes: 2 %
Lymphocytes Relative: 7 %
Lymphs Abs: 1.1 10*3/uL (ref 0.7–4.0)
MCH: 28.7 pg (ref 26.0–34.0)
MCHC: 33.7 g/dL (ref 30.0–36.0)
MCV: 85.3 fL (ref 80.0–100.0)
Monocytes Absolute: 1.4 10*3/uL — ABNORMAL HIGH (ref 0.1–1.0)
Monocytes Relative: 9 %
Neutro Abs: 13.7 10*3/uL — ABNORMAL HIGH (ref 1.7–7.7)
Neutrophils Relative %: 82 %
Platelets: 333 10*3/uL (ref 150–400)
RBC: 3.27 MIL/uL — ABNORMAL LOW (ref 3.87–5.11)
RDW: 14.7 % (ref 11.5–15.5)
WBC: 16.4 10*3/uL — ABNORMAL HIGH (ref 4.0–10.5)
nRBC: 0 % (ref 0.0–0.2)

## 2019-05-05 LAB — COMPREHENSIVE METABOLIC PANEL
ALT: 13 U/L (ref 0–44)
AST: 18 U/L (ref 15–41)
Albumin: 2.3 g/dL — ABNORMAL LOW (ref 3.5–5.0)
Alkaline Phosphatase: 67 U/L (ref 38–126)
Anion gap: 14 (ref 5–15)
BUN: 9 mg/dL (ref 8–23)
CO2: 21 mmol/L — ABNORMAL LOW (ref 22–32)
Calcium: 8 mg/dL — ABNORMAL LOW (ref 8.9–10.3)
Chloride: 97 mmol/L — ABNORMAL LOW (ref 98–111)
Creatinine, Ser: 0.59 mg/dL (ref 0.44–1.00)
GFR calc Af Amer: >60 mL/min (ref >60)
GFR calc non Af Amer: >60 mL/min (ref >60)
Glucose, Bld: 246 mg/dL — ABNORMAL HIGH (ref 70–99)
Potassium: 3.2 mmol/L — ABNORMAL LOW (ref 3.5–5.1)
Sodium: 132 mmol/L — ABNORMAL LOW (ref 135–145)
Total Bilirubin: 0.8 mg/dL (ref 0.3–1.2)
Total Protein: 6.4 g/dL — ABNORMAL LOW (ref 6.5–8.1)

## 2019-05-05 LAB — URINALYSIS, COMPLETE (UACMP) WITH MICROSCOPIC
Bacteria, UA: NONE SEEN
Bilirubin Urine: NEGATIVE
Glucose, UA: 50 mg/dL — AB
Hgb urine dipstick: NEGATIVE
Ketones, ur: 20 mg/dL — AB
Leukocytes,Ua: NEGATIVE
Nitrite: NEGATIVE
Protein, ur: 100 mg/dL — AB
Specific Gravity, Urine: 1.015 (ref 1.005–1.030)
pH: 7 (ref 5.0–8.0)

## 2019-05-05 LAB — MRSA PCR SCREENING: MRSA by PCR: NEGATIVE

## 2019-05-05 LAB — BLOOD GAS, ARTERIAL
Acid-base deficit: 23.5 mmol/L — ABNORMAL HIGH (ref 0.0–2.0)
Bicarbonate: 8.2 mmol/L — ABNORMAL LOW (ref 20.0–28.0)
FIO2: 50
MECHVT: 450 mL
O2 Saturation: 27 %
PEEP: 5 cmH2O
Patient temperature: 36
RATE: 16 resp/min
pCO2 arterial: 36 mmHg (ref 32.0–48.0)
pH, Arterial: 6.95 — CL (ref 7.350–7.450)
pO2, Arterial: <31 mmHg — CL (ref 83.0–108.0)

## 2019-05-05 LAB — GLUCOSE, CAPILLARY: Glucose-Capillary: 275 mg/dL — ABNORMAL HIGH (ref 70–99)

## 2019-05-05 LAB — POC SARS CORONAVIRUS 2 AG: SARS Coronavirus 2 Ag: NEGATIVE

## 2019-05-05 LAB — LIPASE, BLOOD: Lipase: 35 U/L (ref 11–51)

## 2019-05-05 SURGERY — ANEURYSM ABDOMINAL AORTIC REPAIR
Anesthesia: General

## 2019-05-05 SURGERY — ENDOVASCULAR REPAIR/STENT GRAFT
Anesthesia: General

## 2019-05-05 MED ORDER — SODIUM CHLORIDE 0.9% FLUSH: 3.0000 mL | INTRAVENOUS | Status: DC | PRN

## 2019-05-05 MED ORDER — SODIUM CHLORIDE 0.9 % IV SOLN
25.0000 ug/min | INTRAVENOUS | Status: DC
  Administered 2019-05-05: 25 ug/min via INTRAVENOUS
  Filled 2019-05-05 (×2): qty 1

## 2019-05-05 MED ORDER — LACTATED RINGERS IV SOLN: INTRAVENOUS | Status: DC | PRN

## 2019-05-05 MED ORDER — ONDANSETRON HCL 4 MG/2ML IJ SOLN
4.0000 mg | Freq: Once | INTRAMUSCULAR | Status: AC
  Administered 2019-05-05: 4 mg via INTRAVENOUS
  Filled 2019-05-05: qty 2

## 2019-05-05 MED ORDER — ACETAMINOPHEN 325 MG PO TABS: 650.0000 mg | ORAL_TABLET | ORAL | Status: DC | PRN

## 2019-05-05 MED ORDER — IODIXANOL 320 MG/ML IV SOLN
INTRAVENOUS | Status: DC | PRN
  Administered 2019-05-05: 12:00:00 55 mL via INTRA_ARTERIAL

## 2019-05-05 MED ORDER — DEXTROSE 5 % IV SOLN: INTRAVENOUS | Status: DC

## 2019-05-05 MED ORDER — PANTOPRAZOLE SODIUM 40 MG PO TBEC: 40.0000 mg | DELAYED_RELEASE_TABLET | Freq: Every day | ORAL | Status: DC

## 2019-05-05 MED ORDER — LIDOCAINE HCL (CARDIAC) PF 100 MG/5ML IV SOSY
PREFILLED_SYRINGE | INTRAVENOUS | Status: DC | PRN
  Administered 2019-05-05: 80 mg via INTRAVENOUS

## 2019-05-05 MED ORDER — SODIUM CHLORIDE 0.9 % IV SOLN: INTRAVENOUS | Status: DC | PRN

## 2019-05-05 MED ORDER — ACETAMINOPHEN 650 MG RE SUPP: 650.0000 mg | Freq: Four times a day (QID) | RECTAL | Status: DC | PRN

## 2019-05-05 MED ORDER — ALUM & MAG HYDROXIDE-SIMETH 200-200-20 MG/5ML PO SUSP: 15.0000 mL | ORAL | Status: DC | PRN

## 2019-05-05 MED ORDER — LABETALOL HCL 5 MG/ML IV SOLN: 10.0000 mg | INTRAVENOUS | Status: DC | PRN

## 2019-05-05 MED ORDER — CHLORHEXIDINE GLUCONATE 0.12% ORAL RINSE (MEDLINE KIT): 15.0000 mL | Freq: Two times a day (BID) | OROMUCOSAL | Status: DC

## 2019-05-05 MED ORDER — SODIUM CHLORIDE 0.9% IV SOLUTION: Freq: Once | INTRAVENOUS | Status: DC

## 2019-05-05 MED ORDER — SUCCINYLCHOLINE CHLORIDE 20 MG/ML IJ SOLN
INTRAMUSCULAR | Status: DC | PRN
  Administered 2019-05-05: 70 mg via INTRAVENOUS

## 2019-05-05 MED ORDER — HEPARIN SODIUM (PORCINE) 5000 UNIT/ML IJ SOLN
INTRAMUSCULAR | Status: AC
  Filled 2019-05-05: qty 1

## 2019-05-05 MED ORDER — SODIUM CHLORIDE 0.9 % IV SOLN
0.0000 ug/min | INTRAVENOUS | Status: DC
  Filled 2019-05-05 (×2): qty 1

## 2019-05-05 MED ORDER — PHENYLEPHRINE HCL (PRESSORS) 10 MG/ML IV SOLN
INTRAVENOUS | Status: AC
  Filled 2019-05-05: qty 1

## 2019-05-05 MED ORDER — SODIUM CHLORIDE 0.9 % IV SOLN: 250.0000 mL | INTRAVENOUS | Status: DC | PRN

## 2019-05-05 MED ORDER — GLYCOPYRROLATE 1 MG PO TABS
1.0000 mg | ORAL_TABLET | ORAL | Status: DC | PRN
  Filled 2019-05-05: qty 1

## 2019-05-05 MED ORDER — ROCURONIUM BROMIDE 100 MG/10ML IV SOLN
INTRAVENOUS | Status: DC | PRN
  Administered 2019-05-05: 20 mg via INTRAVENOUS

## 2019-05-05 MED ORDER — FAMOTIDINE IN NACL 20-0.9 MG/50ML-% IV SOLN: 20.0000 mg | Freq: Two times a day (BID) | INTRAVENOUS | Status: DC

## 2019-05-05 MED ORDER — MIDAZOLAM HCL 2 MG/2ML IJ SOLN
INTRAMUSCULAR | Status: AC
  Filled 2019-05-05: qty 2

## 2019-05-05 MED ORDER — MIDAZOLAM HCL 2 MG/2ML IJ SOLN
INTRAMUSCULAR | Status: DC | PRN
  Administered 2019-05-05 (×2): 2 mg via INTRAVENOUS

## 2019-05-05 MED ORDER — ONDANSETRON HCL 4 MG/2ML IJ SOLN: 4.0000 mg | Freq: Four times a day (QID) | INTRAMUSCULAR | Status: DC | PRN

## 2019-05-05 MED ORDER — ACETAMINOPHEN 500 MG PO TABS
1000.0000 mg | ORAL_TABLET | Freq: Once | ORAL | Status: AC
  Administered 2019-05-05: 1000 mg via ORAL
  Filled 2019-05-05: qty 2

## 2019-05-05 MED ORDER — NOREPINEPHRINE 4 MG/250ML-% IV SOLN: 2.0000 ug/min | INTRAVENOUS | Status: DC

## 2019-05-05 MED ORDER — SODIUM CHLORIDE 0.9% FLUSH
3.0000 mL | Freq: Two times a day (BID) | INTRAVENOUS | Status: DC
  Administered 2019-05-05: 3 mL via INTRAVENOUS

## 2019-05-05 MED ORDER — DIPHENHYDRAMINE HCL 50 MG/ML IJ SOLN: 25.0000 mg | INTRAMUSCULAR | Status: DC | PRN

## 2019-05-05 MED ORDER — HEPARIN SODIUM (PORCINE) 1000 UNIT/ML IJ SOLN
INTRAMUSCULAR | Status: DC | PRN
  Administered 2019-05-05: 3000 [IU] via INTRAVENOUS

## 2019-05-05 MED ORDER — VASOPRESSIN 20 UNIT/ML IV SOLN
INTRAVENOUS | Status: DC | PRN
  Administered 2019-05-05 (×6): 2 [IU] via INTRAVENOUS
  Administered 2019-05-05: 1 [IU] via INTRAVENOUS
  Administered 2019-05-05: 3 [IU] via INTRAVENOUS
  Administered 2019-05-05: 2 [IU] via INTRAVENOUS
  Administered 2019-05-05: 3 [IU] via INTRAVENOUS
  Administered 2019-05-05 (×4): 2 [IU] via INTRAVENOUS
  Administered 2019-05-05: 3 [IU] via INTRAVENOUS
  Administered 2019-05-05: 2 [IU] via INTRAVENOUS
  Administered 2019-05-05: 3 [IU] via INTRAVENOUS

## 2019-05-05 MED ORDER — FENTANYL CITRATE (PF) 100 MCG/2ML IJ SOLN
50.0000 ug | Freq: Once | INTRAMUSCULAR | Status: AC
  Administered 2019-05-05: 50 ug via INTRAVENOUS
  Filled 2019-05-05: qty 2

## 2019-05-05 MED ORDER — FENTANYL CITRATE (PF) 100 MCG/2ML IJ SOLN: 25.0000 ug | Freq: Once | INTRAMUSCULAR | Status: DC

## 2019-05-05 MED ORDER — PHENOL 1.4 % MT LIQD
1.0000 | OROMUCOSAL | Status: DC | PRN
  Filled 2019-05-05: qty 177

## 2019-05-05 MED ORDER — IOHEXOL 350 MG/ML SOLN
100.0000 mL | Freq: Once | INTRAVENOUS | Status: AC | PRN
  Administered 2019-05-05: 100 mL via INTRAVENOUS

## 2019-05-05 MED ORDER — ETOMIDATE 2 MG/ML IV SOLN
INTRAVENOUS | Status: DC | PRN
  Administered 2019-05-05: 12 mg via INTRAVENOUS

## 2019-05-05 MED ORDER — MORPHINE SULFATE (PF) 2 MG/ML IV SOLN: 2.0000 mg | INTRAVENOUS | Status: DC | PRN

## 2019-05-05 MED ORDER — ACETAMINOPHEN 650 MG RE SUPP: 325.0000 mg | RECTAL | Status: DC | PRN

## 2019-05-05 MED ORDER — PHENYLEPHRINE HCL-NACL 10-0.9 MG/250ML-% IV SOLN
0.0000 ug/min | INTRAVENOUS | Status: DC
  Filled 2019-05-05: qty 250

## 2019-05-05 MED ORDER — GLYCOPYRROLATE 0.2 MG/ML IJ SOLN: 0.2000 mg | INTRAMUSCULAR | Status: DC | PRN

## 2019-05-05 MED ORDER — LIDOCAINE HCL (PF) 2 % IJ SOLN
INTRAMUSCULAR | Status: AC
  Filled 2019-05-05: qty 5

## 2019-05-05 MED ORDER — CEFAZOLIN SODIUM-DEXTROSE 2-3 GM-%(50ML) IV SOLR
INTRAVENOUS | Status: DC | PRN
  Administered 2019-05-05: 2 g via INTRAVENOUS

## 2019-05-05 MED ORDER — ORAL CARE MOUTH RINSE: 15.0000 mL | OROMUCOSAL | Status: DC

## 2019-05-05 MED ORDER — HEPARIN SODIUM (PORCINE) 5000 UNIT/ML IJ SOLN: 5000.0000 [IU] | Freq: Three times a day (TID) | INTRAMUSCULAR | Status: DC

## 2019-05-05 MED ORDER — METOPROLOL TARTRATE 5 MG/5ML IV SOLN: 2.0000 mg | INTRAVENOUS | Status: DC | PRN

## 2019-05-05 MED ORDER — SODIUM CHLORIDE (PF) 0.9 % IJ SOLN
INTRAMUSCULAR | Status: AC
  Filled 2019-05-05: qty 20

## 2019-05-05 MED ORDER — STERILE WATER FOR INJECTION IV SOLN
INTRAVENOUS | Status: DC
  Filled 2019-05-05 (×4): qty 850

## 2019-05-05 MED ORDER — SODIUM CHLORIDE 0.9 % IV SOLN
0.0000 ug/min | INTRAVENOUS | Status: DC
  Administered 2019-05-05: 13:00:00 5 ug/min via INTRAVENOUS
  Filled 2019-05-05: qty 4

## 2019-05-05 MED ORDER — HEPARIN SODIUM (PORCINE) 1000 UNIT/ML IJ SOLN
INTRAMUSCULAR | Status: AC
  Filled 2019-05-05: qty 1

## 2019-05-05 MED ORDER — FENTANYL BOLUS VIA INFUSION
25.0000 ug | INTRAVENOUS | Status: DC | PRN
  Filled 2019-05-05: qty 25

## 2019-05-05 MED ORDER — SODIUM CHLORIDE 0.9 % IV SOLN: 500.0000 mL | Freq: Once | INTRAVENOUS | Status: DC | PRN

## 2019-05-05 MED ORDER — NOREPINEPHRINE BITARTRATE 1 MG/ML IV SOLN
0.0000 ug/min | INTRAVENOUS | Status: DC
  Filled 2019-05-05: qty 4

## 2019-05-05 MED ORDER — HEPARIN SODIUM (PORCINE) 10000 UNIT/ML IJ SOLN
INTRAMUSCULAR | Status: AC
  Filled 2019-05-05: qty 1

## 2019-05-05 MED ORDER — FENTANYL CITRATE (PF) 100 MCG/2ML IJ SOLN
INTRAMUSCULAR | Status: AC
  Filled 2019-05-05: qty 2

## 2019-05-05 MED ORDER — MAGNESIUM SULFATE 2 GM/50ML IV SOLN: 2.0000 g | Freq: Every day | INTRAVENOUS | Status: DC | PRN

## 2019-05-05 MED ORDER — PHENYLEPHRINE HCL (PRESSORS) 10 MG/ML IV SOLN
INTRAVENOUS | Status: DC | PRN
  Administered 2019-05-05: 100 ug via INTRAVENOUS

## 2019-05-05 MED ORDER — HYDRALAZINE HCL 20 MG/ML IJ SOLN: 5.0000 mg | INTRAMUSCULAR | Status: DC | PRN

## 2019-05-05 MED ORDER — GUAIFENESIN-DM 100-10 MG/5ML PO SYRP: 15.0000 mL | ORAL_SOLUTION | ORAL | Status: DC | PRN

## 2019-05-05 MED ORDER — NOREPINEPHRINE 4 MG/250ML-% IV SOLN
0.0000 ug/min | INTRAVENOUS | Status: DC
  Filled 2019-05-05 (×2): qty 250

## 2019-05-05 MED ORDER — VASOPRESSIN 20 UNIT/ML IV SOLN
0.0300 [IU]/min | INTRAVENOUS | Status: DC
  Filled 2019-05-05: qty 2

## 2019-05-05 MED ORDER — ROCURONIUM BROMIDE 50 MG/5ML IV SOLN
INTRAVENOUS | Status: AC
  Filled 2019-05-05: qty 1

## 2019-05-05 MED ORDER — SUCCINYLCHOLINE CHLORIDE 20 MG/ML IJ SOLN
INTRAMUSCULAR | Status: AC
  Filled 2019-05-05: qty 1

## 2019-05-05 MED ORDER — CEFAZOLIN SODIUM 1 G IJ SOLR
INTRAMUSCULAR | Status: AC
  Filled 2019-05-05: qty 20

## 2019-05-05 MED ORDER — ACETAMINOPHEN 325 MG PO TABS: 650.0000 mg | ORAL_TABLET | Freq: Four times a day (QID) | ORAL | Status: DC | PRN

## 2019-05-05 MED ORDER — ACETAMINOPHEN 325 MG PO TABS: 325.0000 mg | ORAL_TABLET | ORAL | Status: DC | PRN

## 2019-05-05 MED ORDER — POLYVINYL ALCOHOL 1.4 % OP SOLN
1.0000 [drp] | Freq: Four times a day (QID) | OPHTHALMIC | Status: DC | PRN
  Filled 2019-05-05: qty 15

## 2019-05-05 MED ORDER — MORPHINE 100MG IN NS 100ML (1MG/ML) PREMIX INFUSION
0.0000 mg/h | INTRAVENOUS | Status: DC
  Filled 2019-05-05: qty 100

## 2019-05-05 MED ORDER — EPINEPHRINE 1 MG/10ML IJ SOSY
PREFILLED_SYRINGE | INTRAMUSCULAR | Status: DC | PRN
  Administered 2019-05-05 (×3): .1 mg via INTRAVENOUS
  Administered 2019-05-05: .02 mg via INTRAVENOUS
  Administered 2019-05-05: .1 mg via INTRAVENOUS
  Administered 2019-05-05: .2 mg via INTRAVENOUS
  Administered 2019-05-05: .1 mg via INTRAVENOUS
  Administered 2019-05-05: .2 mg via INTRAVENOUS
  Administered 2019-05-05 (×3): .1 mg via INTRAVENOUS
  Administered 2019-05-05: .2 mg via INTRAVENOUS
  Administered 2019-05-05 (×2): .1 mg via INTRAVENOUS
  Administered 2019-05-05: .2 mg via INTRAVENOUS
  Administered 2019-05-05: .1 mg via INTRAVENOUS

## 2019-05-05 MED ORDER — FENTANYL 2500MCG IN NS 250ML (10MCG/ML) PREMIX INFUSION: 25.0000 ug/h | INTRAVENOUS | Status: DC

## 2019-05-05 MED ORDER — VASOPRESSIN 20 UNIT/ML IV SOLN
INTRAVENOUS | Status: AC
  Filled 2019-05-05: qty 2

## 2019-05-05 MED ORDER — MORPHINE BOLUS VIA INFUSION
5.0000 mg | INTRAVENOUS | Status: DC | PRN
  Filled 2019-05-05: qty 5

## 2019-05-05 MED ORDER — POTASSIUM CHLORIDE CRYS ER 20 MEQ PO TBCR: 20.0000 meq | EXTENDED_RELEASE_TABLET | Freq: Every day | ORAL | Status: DC | PRN

## 2019-05-05 MED ORDER — ETOMIDATE 2 MG/ML IV SOLN
INTRAVENOUS | Status: AC
  Filled 2019-05-05: qty 10

## 2019-05-05 SURGICAL SUPPLY — 25 items
CANNULA 5F STIFF (CANNULA) ×3 IMPLANT
CATH ACCU-VU SIZ PIG 5F 70CM (CATHETERS) ×3 IMPLANT
CATH BALLN CODA 9X100X32 (BALLOONS) ×6 IMPLANT
CATH BEACON 5 .035 65 KMP TIP (CATHETERS) ×3 IMPLANT
DEVICE CLOSURE PERCLS PRGLD 6F (VASCULAR PRODUCTS) ×11 IMPLANT
DEVICE PRESTO INFLATION (MISCELLANEOUS) IMPLANT
DEVICE TORQUE .025-.038 (MISCELLANEOUS) ×3 IMPLANT
DRYSEAL FLEXSHEATH 12FR 33CM (SHEATH) ×2
DRYSEAL FLEXSHEATH 16FR 33CM (SHEATH) ×4
EXCLUDER TNK 23X14.5MMX12CM (Endovascular Graft) ×1 IMPLANT
EXCLUDER TRUNK 23X14.5MMX12CM (Endovascular Graft) ×3 IMPLANT
GLIDEWIRE STIFF .35X180X3 HYDR (WIRE) ×3 IMPLANT
LEG CONTRALATERAL 16X12X10 (Vascular Products) ×2 IMPLANT
PACK ANGIOGRAPHY (CUSTOM PROCEDURE TRAY) ×3 IMPLANT
PERCLOSE PROGLIDE 6F (VASCULAR PRODUCTS) ×33
SHEATH BRITE TIP 6FRX11 (SHEATH) ×6 IMPLANT
SHEATH BRITE TIP 8FR 5.5 (SHEATH) IMPLANT
SHEATH BRITE TIP 8FRX11 (SHEATH) ×6 IMPLANT
SHEATH DRYSEAL FLEX 12FR 33CM (SHEATH) ×1 IMPLANT
SHEATH DRYSEAL FLEX 16FR 33CM (SHEATH) ×2 IMPLANT
SPONGE XRAY 4X4 16PLY STRL (MISCELLANEOUS) ×6 IMPLANT
STENT GRAFT CONTRALAT 16X12X10 (Vascular Products) ×1 IMPLANT
WIRE AMPLATZ SSTIFF .035X260CM (WIRE) ×6 IMPLANT
WIRE J 3MM .035X145CM (WIRE) ×6 IMPLANT
WIRE MAGIC TORQUE 260C (WIRE) ×3 IMPLANT

## 2019-05-05 SURGICAL SUPPLY — 75 items
APPLIER CLIP 11 MED OPEN (CLIP)
APPLIER CLIP 13 LRG OPEN (CLIP)
APPLIER CLIP 9.375 SM OPEN (CLIP)
BAG DECANTER FOR FLEXI CONT (MISCELLANEOUS) ×1 IMPLANT
BLADE SURG 15 STRL LF DISP TIS (BLADE) ×1 IMPLANT
BLADE SURG 15 STRL SS (BLADE) ×2
BLADE SURG SZ11 CARB STEEL (BLADE) ×3 IMPLANT
BOOT SUTURE AID YELLOW STND (SUTURE) ×3 IMPLANT
BRUSH SCRUB EZ  4% CHG (MISCELLANEOUS) ×2
BRUSH SCRUB EZ 4% CHG (MISCELLANEOUS) ×1 IMPLANT
CANISTER SUCT 1200ML W/VALVE (MISCELLANEOUS) ×3 IMPLANT
CELL SAVER EMERGENCY FEE (MISCELLANEOUS) ×3
CELL SAVER STANDBY W/O COLL (MISCELLANEOUS) ×3
CHLORAPREP W/TINT 26ML (MISCELLANEOUS) ×4 IMPLANT
CLIP APPLIE 11 MED OPEN (CLIP) IMPLANT
CLIP APPLIE 13 LRG OPEN (CLIP) IMPLANT
CLIP APPLIE 9.375 SM OPEN (CLIP) IMPLANT
COVER BACK TABLE REUSABLE LG (DRAPES) ×2 IMPLANT
COVER PROBE FLX POLY STRL (MISCELLANEOUS) ×3 IMPLANT
COVER WAND RF STERILE (DRAPES) ×3 IMPLANT
DRAPE 3/4 80X56 (DRAPES) ×5 IMPLANT
DRAPE INCISE IOBAN 66X60 STRL (DRAPES) ×3 IMPLANT
DRAPE MAG INST 16X20 L/F (DRAPES) ×3 IMPLANT
DRSG OPSITE POSTOP 4X12 (GAUZE/BANDAGES/DRESSINGS) IMPLANT
DRSG OPSITE POSTOP 4X14 (GAUZE/BANDAGES/DRESSINGS) IMPLANT
ELECT BLADE 6.5 EXT (BLADE) ×1 IMPLANT
ELECT CAUTERY BLADE 6.4 (BLADE) ×3 IMPLANT
ELECT REM PT RETURN 9FT ADLT (ELECTROSURGICAL) ×3
ELECTRODE REM PT RTRN 9FT ADLT (ELECTROSURGICAL) ×1 IMPLANT
FEE EMERGENCY CELL SAVER (MISCELLANEOUS) IMPLANT
GLOVE BIO SURGEON STRL SZ7 (GLOVE) ×9 IMPLANT
GLOVE INDICATOR 7.5 STRL GRN (GLOVE) ×3 IMPLANT
GOWN STRL REUS W/ TWL LRG LVL3 (GOWN DISPOSABLE) ×3 IMPLANT
GOWN STRL REUS W/ TWL XL LVL3 (GOWN DISPOSABLE) ×2 IMPLANT
GOWN STRL REUS W/TWL LRG LVL3 (GOWN DISPOSABLE) ×6
GOWN STRL REUS W/TWL XL LVL3 (GOWN DISPOSABLE) ×4
HANDLE YANKAUER SUCT BULB TIP (MISCELLANEOUS) ×1 IMPLANT
HEMOSTAT SURGICEL 2X3 (HEMOSTASIS) ×6 IMPLANT
IV CONNECTOR ONE LINK NDLESS (IV SETS) ×3 IMPLANT
IV NS 500ML (IV SOLUTION)
IV NS 500ML BAXH (IV SOLUTION) ×1 IMPLANT
KIT CATH CVC 3 LUMEN 7FR 8IN (MISCELLANEOUS) ×1 IMPLANT
KIT TURNOVER KIT A (KITS) ×3 IMPLANT
LABEL OR SOLS (LABEL) ×1 IMPLANT
LOOP RED MAXI  1X406MM (MISCELLANEOUS) ×4
LOOP VESSEL MAXI 1X406 RED (MISCELLANEOUS) ×3 IMPLANT
NDL SAFETY ECLIPSE 18X1.5 (NEEDLE) ×1 IMPLANT
NEEDLE HYPO 18GX1.5 SHARP (NEEDLE) ×2
NS IRRIG 1000ML POUR BTL (IV SOLUTION) ×3 IMPLANT
PACK BASIN MAJOR ARMC (MISCELLANEOUS) ×3 IMPLANT
PACK UNIVERSAL (MISCELLANEOUS) ×3 IMPLANT
RETAINER VISCERA MED (MISCELLANEOUS) ×1 IMPLANT
SPONGE LAP 18X18 RF (DISPOSABLE) ×6 IMPLANT
SPONGE LAP 18X36 RFD (DISPOSABLE) ×3 IMPLANT
STANDBY W/O COLL CELL SAVER (MISCELLANEOUS) IMPLANT
STAPLER SKIN PROX 35W (STAPLE) ×2 IMPLANT
SUT PDS AB 1 TP1 96 (SUTURE) ×2 IMPLANT
SUT PROLENE 2 0 SH DA (SUTURE) ×5 IMPLANT
SUT PROLENE 6 0 BV (SUTURE) ×6 IMPLANT
SUT SILK 0 (SUTURE)
SUT SILK 0 30XBRD TIE 6 (SUTURE) ×1 IMPLANT
SUT SILK 2 0 (SUTURE)
SUT SILK 2 0 SH (SUTURE) ×6 IMPLANT
SUT SILK 2-0 30XBRD TIE 12 (SUTURE) ×1 IMPLANT
SUT SILK 3 0 (SUTURE)
SUT SILK 3-0 18XBRD TIE 12 (SUTURE) ×1 IMPLANT
SUT SILK 4 0 (SUTURE) ×2
SUT SILK 4-0 18XBRD TIE 12 (SUTURE) ×1 IMPLANT
SUT VIC AB 0 CT1 36 (SUTURE) ×4 IMPLANT
SUT VIC AB 2-0 CT1 (SUTURE) ×3 IMPLANT
SYR 20ML LL LF (SYRINGE) ×3 IMPLANT
SYR 3ML LL SCALE MARK (SYRINGE) ×3 IMPLANT
SYR BULB IRRIG 60ML STRL (SYRINGE) ×3 IMPLANT
TAPE UMBIL 1/8X18 RADIOPA (MISCELLANEOUS) ×1 IMPLANT
TRAY FOLEY MTR SLVR 16FR STAT (SET/KITS/TRAYS/PACK) ×3 IMPLANT

## 2019-05-06 ENCOUNTER — Encounter: Payer: Self-pay | Admitting: Certified Registered Nurse Anesthetist

## 2019-05-06 LAB — BPAM RBC
Blood Product Expiration Date: 202101112359
Blood Product Expiration Date: 202101122359
ISSUE DATE / TIME: 202012261140
ISSUE DATE / TIME: 202012261140
Unit Type and Rh: 5100
Unit Type and Rh: 5100

## 2019-05-06 LAB — TYPE AND SCREEN
ABO/RH(D): O POS
Antibody Screen: NEGATIVE
Unit division: 0
Unit division: 0

## 2019-05-06 LAB — PREPARE RBC (CROSSMATCH)

## 2019-05-07 ENCOUNTER — Encounter: Payer: Self-pay | Admitting: Cardiology

## 2019-05-07 NOTE — Anesthesia Postprocedure Evaluation (Signed)
Anesthesia Post Note  Patient: Denise Cervantes  Procedure(s) Performed: ANEURYSM ABDOMINAL AORTIC REPAIR (N/A )  Patient location: [place]. Anesthesia Type: General Post-procedure mental status: Deceased. Respiratory status: Deceased. Cardiovascular status: Deceased. 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  She was taken to the ICU intubated and on pressors.  BP was supported with inotropes in the ICU and despite this was in multisystem organ failure.  Family made patient comfort care and patient died on 12-May-2023.     Last Vitals:  Vitals:   05/15/2019 1317 05/22/2019 1400  BP:  (!) 42/31  Pulse:    Resp:  16  Temp:    SpO2: 96%     Last Pain:  Vitals:   06/01/2019 1300  TempSrc: Axillary  PainSc:                  [name]

## 2019-05-11 NOTE — Progress Notes (Signed)
Family At bedside, clinical status relayed to family  Updated and notified of patients medical condition-  Progressive multiorgan failure with very low chance of meaningful recovery.  Patient is in dying  Process.  Family understands the situation.  They have consented and agreed to DNR/DNI and would like to proceed with Comfort care measures.  They DO NOT WANT TO PROLONG HER SUFFERING   Family are satisfied with Plan of action and management. All questions answered  Corrin Parker, M.D.  [facility]  Medical Director [place] Director [facility] Cardio-Pulmonary Department

## 2019-05-11 NOTE — H&P (Signed)
Denise Cervantes is an 86 y.o. female.   Chief Complaint: Ruptured AAA HPI: Patient developed abdominal pain 2-3 days ago. States it became progressively worse this morning. Denies back or groin pain. Denies nausea, no vomiting. Denies syncope.  Past Medical History:  Diagnosis Date  . A-fib ([place])   . Bronchitis, obstructive, chronic ([place])   . COPD exacerbation ([place])   . Depression   . Diabetes ([place])    type II  . Esophageal reflux disease   . Fibromyalgia   . Glaucoma   . HBP (high blood pressure)   . Hypercholesterolemia   . Hypertension   . Memory loss     Past Surgical History:  Procedure Laterality Date  . HIP ARTHROPLASTY Right 03/25/2019   Procedure: ARTHROPLASTY BIPOLAR HIP (HEMIARTHROPLASTY);  Surgeon: Lovell Sheehan, MD;  Location: [facility] ORS;  Service: Orthopedics;  Laterality: Right;    History reviewed. No pertinent family history. Social History:  reports that she has quit smoking. Her smoking use included cigarettes. She has never used smokeless tobacco. She reports that she does not drink alcohol or use drugs.  Allergies:  Allergies  Allergen Reactions  . Penicillins     (Not in a hospital admission)   Results for orders placed or performed during the hospital encounter of 05/09/2019 (from the past 48 hour(s))  CBC with Differential     Status: Abnormal   Collection Time: 04/12/2019  7:29 AM  Result Value Ref Range   WBC 16.4 (H) 4.0 - 10.5 K/uL   RBC 3.27 (L) 3.87 - 5.11 MIL/uL   Hemoglobin 9.4 (L) 12.0 - 15.0 g/dL   HCT 27.9 (L) 36.0 - 46.0 %   MCV 85.3 80.0 - 100.0 fL   MCH 28.7 26.0 - 34.0 pg   MCHC 33.7 30.0 - 36.0 g/dL   RDW 14.7 11.5 - 15.5 %   Platelets 333 150 - 400 K/uL   nRBC 0.0 0.0 - 0.2 %   Neutrophils Relative % 82 %   Neutro Abs 13.7 (H) 1.7 - 7.7 K/uL   Lymphocytes Relative 7 %   Lymphs Abs 1.1 0.7 - 4.0 K/uL   Monocytes Relative 9 %   Monocytes Absolute 1.4 (H) 0.1 - 1.0 K/uL   Eosinophils Relative 0 %   Eosinophils Absolute 0.0  0.0 - 0.5 K/uL   Basophils Relative 0 %   Basophils Absolute 0.0 0.0 - 0.1 K/uL   Immature Granulocytes 2 %   Abs Immature Granulocytes 0.25 (H) 0.00 - 0.07 K/uL    Comment: Performed at [facility], [address] [place], [place] [address]  Comprehensive metabolic panel     Status: Abnormal   Collection Time: 05/01/2019  7:29 AM  Result Value Ref Range   Sodium 132 (L) 135 - 145 mmol/L   Potassium 3.2 (L) 3.5 - 5.1 mmol/L   Chloride 97 (L) 98 - 111 mmol/L   CO2 21 (L) 22 - 32 mmol/L   Glucose, Bld 246 (H) 70 - 99 mg/dL   BUN 9 8 - 23 mg/dL   Creatinine, Ser 0.59 0.44 - 1.00 mg/dL   Calcium 8.0 (L) 8.9 - 10.3 mg/dL   Total Protein 6.4 (L) 6.5 - 8.1 g/dL   Albumin 2.3 (L) 3.5 - 5.0 g/dL   AST 18 15 - 41 U/L   ALT 13 0 - 44 U/L   Alkaline Phosphatase 67 38 - 126 U/L   Total Bilirubin 0.8 0.3 - 1.2 mg/dL   GFR calc  non Af Amer >60 >60 mL/min   GFR calc Af Amer >60 >60 mL/min   Anion gap 14 5 - 15    Comment: Performed at [facility], [address] [place], [place] [address]  Lipase, blood     Status: None   Collection Time: 05/21/19  7:29 AM  Result Value Ref Range   Lipase 35 11 - 51 U/L    Comment: Performed at [facility], [address] [place], [place] [address]  Urinalysis, Complete w Microscopic     Status: Abnormal   Collection Time: 05-21-2019  8:01 AM  Result Value Ref Range   Color, Urine YELLOW (A) YELLOW   APPearance CLOUDY (A) CLEAR   Specific Gravity, Urine 1.015 1.005 - 1.030   pH 7.0 5.0 - 8.0   Glucose, UA 50 (A) NEGATIVE mg/dL   Hgb urine dipstick NEGATIVE NEGATIVE   Bilirubin Urine NEGATIVE NEGATIVE   Ketones, ur 20 (A) NEGATIVE mg/dL   Protein, ur 098 (A) NEGATIVE mg/dL   Nitrite NEGATIVE NEGATIVE   Leukocytes,Ua NEGATIVE NEGATIVE   RBC / HPF 0-5 0 - 5 RBC/hpf   WBC, UA 0-5 0 - 5 WBC/hpf   Bacteria, UA NONE SEEN NONE SEEN   Squamous Epithelial / LPF 0-5 0 - 5   Mucus PRESENT    Hyaline Casts, UA PRESENT     Amorphous Crystal PRESENT     Comment: Performed at [facility], [address], [place], [place] [address]   CT Renal Stone Study  Result Date: 05-21-19 CLINICAL DATA:  Abdominal pain.  COVID-19 positive. EXAM: CT ABDOMEN AND PELVIS WITHOUT CONTRAST TECHNIQUE: Multidetector CT imaging of the abdomen and pelvis was performed following the standard protocol without IV contrast. COMPARISON:  None. FINDINGS: Lower chest: Mild peripheral interstitial changes over the lung bases. No airspace consolidation or effusion. Calcification of the mitral valve annulus. Calcification over the right coronary artery as well as calcification over the descending thoracic aorta. Hepatobiliary: Liver, gallbladder and biliary tree are normal. Pancreas: Normal. Spleen: Normal. Adrenals/Urinary Tract: Adrenal glands are normal. Kidneys are normal in size without hydronephrosis or nephrolithiasis. Ureters and bladder are normal. Distal ureters are partially obscured by streak artifact from right hip prosthesis. Stomach/Bowel: Stomach is normal. Small bowel is unremarkable. Appendix is normal. Moderate diverticulosis of the colon. Vascular/Lymphatic: There is calcified plaque over the abdominal aorta. There is aneurysmal dilatation of the infrarenal abdominal aorta beginning immediately below the renal arteries measuring 5.3 cm in AP diameter. Aneurysm extends to the bifurcation. There is moderate increased density material adjacent to the distal abdominal aorta likely due to rupture of patient's aortic aneurysm. This area of hemorrhage measures approximately 5.7 x 12.4 cm. No adenopathy. Reproductive: Previous hysterectomy. Other: Mild free fluid/hemorrhage in the pelvis. Musculoskeletal: Degenerative change of the spine. Right hip prosthesis intact. IMPRESSION: 1. Infrarenal abdominal aortic aneurysm measuring 5.3 cm in AP diameter with evidence of acute rupture. Adjacent collection hemorrhage measuring approximately 5.7  x 12.4 cm. 2.  Colonic diverticulosis. 3. Aortic Atherosclerosis (ICD10-I70.0). Aortic Atherosclerosis (ICD10-I70.0). Atherosclerotic coronary artery disease. Critical Value/emergent results were called by telephone at the time of interpretation on 05/21/2019 at 9:05 a.m. to providerKEVIN PADUCHOWSKI , who verbally acknowledged these results. Electronically Signed   By: Elberta Fortis M.D.   On: 05/21/2019 09:17   CT Angio Chest/Abd/Pel for Dissection W and/or Wo Contrast  Result Date: 05/21/19 CLINICAL DATA:  COVID-19 positive. Recent noncontrast abdominal CT demonstrates abdominal aortic aneurysm repair. EXAM: CT  ANGIOGRAPHY CHEST, ABDOMEN AND PELVIS TECHNIQUE: Multidetector CT imaging through the chest, abdomen and pelvis was performed using the standard protocol during bolus administration of intravenous contrast. Multiplanar reconstructed images and MIPs were obtained and reviewed to evaluate the vascular anatomy. CONTRAST:  OMNIPAQUE IOHEXOL 350 MG/ML SOLN COMPARISON:  None. FINDINGS: CTA CHEST FINDINGS Cardiovascular: Mild cardiomegaly. Calcification over the mitral valve annulus. There is calcified plaque over the 3 vessel coronary arteries as well as calcified plaque over the thoracic aorta. The thoracic aorta measures 3.2 cm in AP diameter. Mild mural thrombus over the proximal descending thoracic aorta. Pulmonary arterial system is unremarkable. Remaining vascular structures are unremarkable. Mediastinum/Nodes: 1.2 cm precarinal lymph node and adjacent 1.1 cm precarinal lymph node. These are likely reactive. No mediastinal adenopathy. Remaining mediastinal structures are unremarkable. Lungs/Pleura: Lungs are adequately inflated demonstrate patchy bilateral peripheral airspace process likely due to multifocal pneumonia this patient who is COVID-19 positive. No effusion. Airways are normal. Musculoskeletal: Degenerative change of the spine. Mild compression form is of T3 and T6. Review of the  MIP images confirms the above findings. CTA ABDOMEN AND PELVIS FINDINGS VASCULAR Aorta: Moderate calcified plaque over the abdominal aorta. Aneurysmal dilatation of the infrarenal abdominal aorta measuring 5.5 x 6 cm in AP and transverse dimension. There is a rind of intramural thrombus. There is no evidence of active extravasation. Again noted is moderate retroperitoneal hemorrhage adjacent to the aortic aneurysm likely from recent rupture which is now contained. Sided fracture may be over the right lateral wall of the aneurysm with there is subtle wide mouth projection of the lumen. Aneurysm extends to the level of the bifurcation. Celiac: Focal stenosis at the origin as the celiac axis is otherwise patent. SMA: Patent. Renals: Moderate calcified plaque at the origin and proximal renal arteries which are otherwise patent. Likely mild focal stenosis at the origin of the right renal artery. IMA: Patent. Inflow: No significant focal stenosis or occlusion. Calcified plaque over the common iliac arteries. Veins: Unremarkable. Review of the MIP images confirms the above findings. NON-VASCULAR Hepatobiliary: Liver, gallbladder and biliary tree are normal. Pancreas: Normal. Spleen: Normal. Adrenals/Urinary Tract: Adrenal glands are normal. Kidneys are normal in size without hydronephrosis or nephrolithiasis. Ureters and bladder are unremarkable. The distal ureters are obscured by streak artifact from right hip prosthesis. Stomach/Bowel: Stomach and small bowel are normal. Appendix is normal. Moderate colonic diverticulosis most prominent over the sigmoid colon. Lymphatic: No adenopathy. Reproductive: Previous hysterectomy. Other: Mild hemorrhage over the pelvis. Musculoskeletal: Degenerative change of the spine with multilevel disc disease over the lumbar spine. Degenerative change of the left hip. Right hip prosthesis intact. Review of the MIP images confirms the above findings. IMPRESSION: 1. Infrarenal abdominal aortic  aneurysm measuring 5.5 x 6 cm in AP and transverse dimension and extending to the bifurcation. Evidence of recent aneurysm rupture which is now contained. No active extravasation. 2. No evidence of aneurysm or dissection involving the thoracic aorta. 3. Aortic Atherosclerosis (ICD10-I70.0). Mild focal stenosis at the origin of the celiac axis as well as origin of the right renal artery. 4. Mild patchy peripheral multifocal airspace process compatible with multifocal pneumonia in this COVID-19 positive patient. 5.  Mild cardiomegaly.  Atherosclerotic coronary artery disease. 6.  Colonic diverticulosis. 7.  Mild compression deformities of T3 and T6 age indeterminate. Critical Value/emergent results were called by telephone at the time of interpretation on 06-May-2019 at 10:11 am to providerKEVIN PADUCHOWSKI , who verbally acknowledged these results. Electronically Signed   By: Reuel Boom  Micheline MazeBoyle M.D.   On: 05/09/2019 10:12    Review of Systems  Constitutional: Negative for activity change and fatigue.  HENT: Negative.   Eyes: Negative.   Respiratory: Negative for cough, chest tightness and shortness of breath.   Cardiovascular: Negative for chest pain and leg swelling.  Gastrointestinal: Positive for abdominal pain. Negative for nausea and vomiting.  Genitourinary: Negative.   Musculoskeletal: Positive for back pain.  Skin: Negative.   Neurological: Negative for dizziness and syncope.    Blood pressure (!) 155/83, pulse 99, temperature 98.3 F (36.8 C), temperature source Oral, resp. rate (!) 21, height 5\' 2"  (1.575 m), weight 56.7 kg, SpO2 98 %. Physical Exam  Nursing note and vitals reviewed. Constitutional: She is oriented to person, place, and time. She appears well-developed and well-nourished.  Eyes: Pupils are equal, round, and reactive to light.  Cardiovascular: Normal rate and regular rhythm.  Respiratory: Effort normal and breath sounds normal. No respiratory distress.  GI: Soft. She  exhibits distension. There is abdominal tenderness. There is guarding. There is no rebound.  Musculoskeletal:        General: No tenderness or edema.     Cervical back: Normal range of motion and neck supple.  Neurological: She is alert and oriented to person, place, and time.  Skin: Skin is warm.     Assessment/Plan Contained Ruptured AAA.  Will proceed with Endovascular Abdominal aortic repair. Extensive discussion with patient and daughter regarding risks, benefits and alternatives. Explained high risk or mortality considering the patients age and ruptured AAA.  Explained risk of organ failure, renal and  respiratory risks. All questions answered. Understanding expressed. Wishes to remain a full code. Consent obtained from daughter Dione PloverCathy Koontz- POA.  Bertram DenverEsco, Lummie Montijo A, MD 04/12/2019, 10:29 AM

## 2019-05-11 NOTE — Op Note (Signed)
OPERATIVE NOTE   PROCEDURE: 1. US guidance for vascular access, bilateral femoral arteries 2. Catheter placement into aorta from bilateral femoral approaches 3. Placement of an aortic balloon proximal to the visceral vessels for blood pressure support using a large compliant Coda balloon 4. Placement of a 23 mm x 12 cm Gore Excluder Endoprosthesis main body left with a 12 mm x 10 cm right contralateral limb 5. ProGlide closure devices bilateral femoral arteries  PRE-OPERATIVE DIAGNOSIS: Ruptured AAA with hemodynamic instability  POST-OPERATIVE DIAGNOSIS: same  SURGEON: Leotis Pain, MD   ASSISTANT: Dolores Frame, MD  ANESTHESIA: General  ESTIMATED BLOOD LOSS: 50 cc  FINDING(S): 1.  AAA  SPECIMEN(S):  none  INDICATIONS:   Denise Cervantes is a 86 y.o. female who presents with a ruptured abdominal aortic aneurysm measuring approximately 6 cm.  She was initially stable in the emergency department, but became unstable with a drop in her blood pressure and at the time of induction of anesthesia she was markedly hypotensive.. The anatomy was suitable for endovascular repair.  Risks and benefits of repair in an endovascular fashion were discussed and informed consent was obtained. Co-surgeons are used to expedite the procedure and reduce operative time as bilateral work needs to be done.  DESCRIPTION: After obtaining full informed written consent, the patient was brought back to the operating room and placed supine upon the operating table.  The patient received IV antibiotics prior to induction.  After obtaining adequate anesthesia, the patient was prepped and draped in the standard fashion for endovascular AAA repair.  We then began by gaining access to both femoral arteries with US guidance.  The femoral arteries were found to be patent and accessed without difficulty with a needle under ultrasound guidance without difficulty on each side and permanent images were recorded.  We then placed  2 proglide devices on each side in a pre-close fashion and placed 8 French sheaths. The patient was then given 3000 units of intravenous heparin.  Due to her profound hypotension, the decision was made to use an aortic balloon to create a suprarenal clamp to improve her blood pressure.  Over a stiff wire, a 16 French sheath was placed up the left side and the large compliant balloon was inflated in the suprarenal and supra mesenteric aorta to create a proximal clamp to raise her blood pressure.  The Pigtail catheter was placed into the aorta from the right side. Using this image, we selected a 23 mm diameter by 12 cm length Main body device.  The main body was then placed through the 16 French sheath on the left side after a 12 French sheath was placed up the right side.  The compliant balloon was then placed up the right side and a suprarenal and supra mesenteric inflation was created to improve her blood pressure.  The main body was then deployed just below the lowest renal artery which was the left renal artery.  We used the shortest main body device and we went ahead and completed the deployment from the left side so that we could put the balloon back up the left side for tamponade while we cannulated the gate.  The balloon was placed up the left side and the compliant balloon from the right side was deflated.  The aortic occlusion balloon was then inflated in the super mesenteric aorta from the left side. The Kumpe catheter and stiff Glidewire was used to cannulate the contralateral gate without difficulty and successful cannulation was confirmed  by twirling the pigtail catheter in the main body. We then placed a stiff wire and a retrograde arteriogram was performed through the right femoral sheath.  At this point, a 12 mm diameter by 10 cm length right iliac limb was selected and deployed just above the right hypogastric artery.  Based off the angiographic findings, extension limbs were not necessary.  Aortic  occlusion balloon was then deflated. All junction points and seals zones were treated with the compliant balloon. The pigtail catheter was then replaced and a completion angiogram was performed.  No endoleak was detected on completion angiography and there was no further bleeding into the aneurysm sac. The renal arteries were found to be widely patent.  Hypogastric arteries remain patent. At this point we elected to terminate the procedure. We secured the pro glide devices for hemostasis on the femoral arteries. The skin incision was closed with a 4-0 Monocryl. Dermabond and pressure dressing were placed. The patient was taken to the recovery room in stable condition having tolerated the procedure well.  COMPLICATIONS: none  CONDITION: stable  Festus Barren  05/21/2019, 12:41 PM   This note was created with Dragon Medical transcription system. Any errors in dictation are purely unintentional.

## 2019-05-11 NOTE — H&P (Signed)
Name: Denise Cervantes MRN: [id number] DOB: 02/11/1926     CONSULTATION DATE: 02-23-19  REFERRING MD :  Evie LacksESCO  CHIEF COMPLAINT: RUPTUREED AAA  HISTORY OF PRESENT ILLNESS:    Patient came to ER 2-3 days abd pain Found to have ruptured AAA Taken to OR emergently  PROCEDURE: 1. US guidance for vascular access, bilateral femoral arteries 2. Catheter placement into aorta from bilateral femoral approaches 3. Placement of an aortic balloon proximal to the visceral vessels for blood pressure support using a large compliant Coda balloon 4. Placement of a 23 mm x 12 cm Gore Excluder Endoprosthesis main body left with a 12 mm x 10 cm right contralateral limb 5. ProGlide closure devices bilateral femoral arteries  PRE-OPERATIVE DIAGNOSIS: Ruptured AAA with hemodynamic instability    POST OP RESP FAILURE POST OP HYPOVOLUMIC SHOCK  PATIENT IS DNR  POOR PROGNOSIS   PAST MEDICAL HISTORY :   has a past medical history of A-fib (HCC), Bronchitis, obstructive, chronic (HCC), COPD exacerbation (HCC), Depression, Diabetes (HCC), Esophageal reflux disease, Fibromyalgia, Glaucoma, HBP (high blood pressure), Hypercholesterolemia, Hypertension, and Memory loss.  has a past surgical history that includes Hip Arthroplasty (Right, 03/25/2019). Prior to Admission medications   Medication Sig Start Date End Date Taking? Authorizing Provider  amiodarone (PACERONE) 200 MG tablet One tablet twice a day for seven days then one tablet daily Patient taking differently: Take 200 mg by mouth daily.  09/05/17  Yes Wieting, Richard, MD  amLODipine (NORVASC) 5 MG tablet Take 5 mg by mouth daily.   Yes [provider]  apixaban (ELIQUIS) 2.5 MG TABS tablet Take 1 tablet (2.5 mg total) by mouth 2 (two) times daily. 03/28/19  Yes Leatha GildingGherghe, Costin M, MD  Biotin 5 MG CAPS Take 1 capsule by mouth daily.   Yes [provider]  brimonidine-timolol (COMBIGAN) 0.2-0.5 % ophthalmic solution Place 1 drop  into the right eye 2 (two) times daily.   Yes [provider]  Calcium Carbonate Antacid 400 MG CHEW Chew 400 mg by mouth daily as needed (stomach acid).    Yes [provider]  Cholecalciferol 1000 units capsule Take 1,000 Units by mouth daily.    Yes [provider]  diltiazem (CARDIZEM CD) 120 MG 24 hr capsule Take 1 capsule (120 mg total) by mouth daily. 03/28/19 03/27/20 Yes Gherghe, Daylene Katayamaostin M, MD  escitalopram (LEXAPRO) 5 MG tablet Take 1 tablet by mouth daily. 08/26/17  Yes [provider]  latanoprost (XALATAN) 0.005 % ophthalmic solution Place 1 drop into both eyes at bedtime.   Yes [provider]  losartan (COZAAR) 100 MG tablet Take 100 mg by mouth daily.   Yes [provider]  metFORMIN (GLUCOPHAGE) 500 MG tablet Take 500 mg by mouth 2 (two) times daily with a meal.   Yes [provider]  mometasone-formoterol (DULERA) 100-5 MCG/ACT AERO Inhale 2 puffs into the lungs 2 (two) times daily. 09/05/17  Yes Wieting, Richard, MD  rosuvastatin (CRESTOR) 5 MG tablet Take 5 mg by mouth daily. 08/25/17  Yes [provider]  traZODone (DESYREL) 50 MG tablet Take 50 mg by mouth at bedtime as needed for sleep.    Yes [provider]  acetaminophen (TYLENOL) 500 MG tablet Take 1,000 mg by mouth every 8 (eight) hours as needed for mild pain.     [provider]  albuterol (PROVENTIL HFA;VENTOLIN HFA) 108 (90 Base) MCG/ACT inhaler Inhale 2 puffs into the lungs every 6 (six) hours as needed for  wheezing or shortness of breath. 09/05/17   Loletha Grayer, MD  tiotropium (SPIRIVA HANDIHALER) 18 MCG inhalation capsule Place 1 capsule (18 mcg total) into inhaler and inhale daily. 09/05/17 09/05/18  Loletha Grayer, MD   Allergies  Allergen Reactions  . Penicillins Rash  . Other Other (See Comments)    Statins-muscle pain  . Rivaroxaban Other (See Comments)    FAMILY HISTORY:  family history is not on file. SOCIAL  HISTORY:  reports that she has quit smoking. Her smoking use included cigarettes. She has never used smokeless tobacco. She reports that she does not drink alcohol or use drugs.  REVIEW OF SYSTEMS:   Unable to obtain due to critical illness   VITAL SIGNS: Temp:  [89.8 F (32.1 C)-98.3 F (36.8 C)] 89.8 F (32.1 C) (12/26 1225) Pulse Rate:  [95-112] 112 (12/26 1030) Resp:  [11-23] 11 (12/26 1300) BP: (64-156)/(48-88) 64/54 (12/26 1300) SpO2:  [96 %-98 %] 96 % (12/26 1030) Weight:  [56.7 kg] 56.7 kg (12/26 0717)   No intake/output data recorded. Total I/O In: 9528 [I.V.:800; Blood:675] Out: 50 [Urine:50]   SpO2: 96 %  COVID-19 DISASTER DECLARATION:   FULL CONTACT PHYSICAL EXAMINATION WAS NOT POSSIBLE DUE TO TREATMENT OF COVID-19 AND   CONSERVATION OF PERSONAL PROTECTIVE EQUIPMENT, LIMITED EXAM FINDINGS INCLUDE-   Patient assessed or the symptoms described in the history of present illness.   In the context of the Global COVID-19 pandemic, which necessitated consideration that the patient might be at risk for infection with the SARS-CoV-2 virus that causes COVID-19, Institutional protocols and algorithms that pertain to the evaluation of patients at risk for COVID-19 are in a state of rapid change based on information released by regulatory bodies including the CDC and federal and state organizations. These policies and algorithms were followed during the patient's care while in hospital.    MEDICATIONS: I have reviewed all medications and confirmed regimen as documented   CULTURE RESULTS   Recent Results (from the past 240 hour(s))  Respiratory Panel by RT PCR (Flu A&B, Covid) - Nasopharyngeal Swab     Status: Abnormal   Collection Time: 05-14-19 10:10 AM   Specimen: Nasopharyngeal Swab  Result Value Ref Range Status   SARS Coronavirus 2 by RT PCR POSITIVE (A) NEGATIVE Final    Comment: RESULT CALLED TO, READ BACK BY AND VERIFIED WITH: SHARON MOORE AT 1110  05-14-2019.PMF (NOTE) SARS-CoV-2 target nucleic acids are DETECTED. SARS-CoV-2 RNA is generally detectable in upper respiratory specimens  during the acute phase of infection. Positive results are indicative of the presence of the identified virus, but do not rule out bacterial infection or co-infection with other pathogens not detected by the test. Clinical correlation with patient history and other diagnostic information is necessary to determine patient infection status. The expected result is Negative. Fact Sheet for Patients:  PinkCheek.be Fact Sheet for Healthcare Providers: GravelBags.it This test is not yet approved or cleared by the [place] FDA and  has been authorized for detection and/or diagnosis of SARS-CoV-2 by FDA under an Emergency Use Authorization (EUA).  This EUA will remain in effect (meaning this test can be used) f or the duration of  the COVID-19 declaration under Section 564(b)(1) of the Act, 21 U.S.C. section 360bbb-3(b)(1), unless the authorization is terminated or revoked sooner.    Influenza A by PCR NEGATIVE NEGATIVE Final   Influenza B by PCR NEGATIVE NEGATIVE Final    Comment: (NOTE) The Xpert Xpress SARS-CoV-2/FLU/RSV assay is intended as an  aid in  the diagnosis of influenza from Nasopharyngeal swab specimens and  should not be used as a sole basis for treatment. Nasal washings and  aspirates are unacceptable for Xpert Xpress SARS-CoV-2/FLU/RSV  testing. Fact Sheet for Patients: https://www.moore.com/ Fact Sheet for Healthcare Providers: https://www.young.biz/ This test is not yet approved or cleared by the [place] FDA and  has been authorized for detection and/or diagnosis of SARS-CoV-2 by  FDA under an Emergency Use Authorization (EUA). This EUA will remain  in effect (meaning this test can be used) for the duration of the  Covid-19  declaration under Section 564(b)(1) of the Act, 21  U.S.C. section 360bbb-3(b)(1), unless the authorization is  terminated or revoked. Performed at [facility], [address] [place] [place], [place] [address]           IMAGING    PERIPHERAL VASCULAR CATHETERIZATION  Result Date: 04/18/2019 See op note  CT Renal Stone Study  Result Date: 04/10/2019 CLINICAL DATA:  Abdominal pain.  COVID-19 positive. EXAM: CT ABDOMEN AND PELVIS WITHOUT CONTRAST TECHNIQUE: Multidetector CT imaging of the abdomen and pelvis was performed following the standard protocol without IV contrast. COMPARISON:  None. FINDINGS: Lower chest: Mild peripheral interstitial changes over the lung bases. No airspace consolidation or effusion. Calcification of the mitral valve annulus. Calcification over the right coronary artery as well as calcification over the descending thoracic aorta. Hepatobiliary: Liver, gallbladder and biliary tree are normal. Pancreas: Normal. Spleen: Normal. Adrenals/Urinary Tract: Adrenal glands are normal. Kidneys are normal in size without hydronephrosis or nephrolithiasis. Ureters and bladder are normal. Distal ureters are partially obscured by streak artifact from right hip prosthesis. Stomach/Bowel: Stomach is normal. Small bowel is unremarkable. Appendix is normal. Moderate diverticulosis of the colon. Vascular/Lymphatic: There is calcified plaque over the abdominal aorta. There is aneurysmal dilatation of the infrarenal abdominal aorta beginning immediately below the renal arteries measuring 5.3 cm in AP diameter. Aneurysm extends to the bifurcation. There is moderate increased density material adjacent to the distal abdominal aorta likely due to rupture of patient's aortic aneurysm. This area of hemorrhage measures approximately 5.7 x 12.4 cm. No adenopathy. Reproductive: Previous hysterectomy. Other: Mild free fluid/hemorrhage in the pelvis. Musculoskeletal: Degenerative change of the  spine. Right hip prosthesis intact. IMPRESSION: 1. Infrarenal abdominal aortic aneurysm measuring 5.3 cm in AP diameter with evidence of acute rupture. Adjacent collection hemorrhage measuring approximately 5.7 x 12.4 cm. 2.  Colonic diverticulosis. 3. Aortic Atherosclerosis (ICD10-I70.0). Aortic Atherosclerosis (ICD10-I70.0). Atherosclerotic coronary artery disease. Critical Value/emergent results were called by telephone at the time of interpretation on 04/14/2019 at 9:05 a.m. to providerKEVIN PADUCHOWSKI , who verbally acknowledged these results. Electronically Signed   By: Elberta Fortis M.D.   On: 04/15/2019 09:17   CT Angio Chest/Abd/Pel for Dissection W and/or Wo Contrast  Result Date: 04/26/2019 CLINICAL DATA:  COVID-19 positive. Recent noncontrast abdominal CT demonstrates abdominal aortic aneurysm repair. EXAM: CT ANGIOGRAPHY CHEST, ABDOMEN AND PELVIS TECHNIQUE: Multidetector CT imaging through the chest, abdomen and pelvis was performed using the standard protocol during bolus administration of intravenous contrast. Multiplanar reconstructed images and MIPs were obtained and reviewed to evaluate the vascular anatomy. CONTRAST:  OMNIPAQUE IOHEXOL 350 MG/ML SOLN COMPARISON:  None. FINDINGS: CTA CHEST FINDINGS Cardiovascular: Mild cardiomegaly. Calcification over the mitral valve annulus. There is calcified plaque over the 3 vessel coronary arteries as well as calcified plaque over the thoracic aorta. The thoracic aorta measures 3.2 cm in AP diameter. Mild mural thrombus over the proximal descending thoracic  aorta. Pulmonary arterial system is unremarkable. Remaining vascular structures are unremarkable. Mediastinum/Nodes: 1.2 cm precarinal lymph node and adjacent 1.1 cm precarinal lymph node. These are likely reactive. No mediastinal adenopathy. Remaining mediastinal structures are unremarkable. Lungs/Pleura: Lungs are adequately inflated demonstrate patchy bilateral peripheral airspace process  likely due to multifocal pneumonia this patient who is COVID-19 positive. No effusion. Airways are normal. Musculoskeletal: Degenerative change of the spine. Mild compression form is of T3 and T6. Review of the MIP images confirms the above findings. CTA ABDOMEN AND PELVIS FINDINGS VASCULAR Aorta: Moderate calcified plaque over the abdominal aorta. Aneurysmal dilatation of the infrarenal abdominal aorta measuring 5.5 x 6 cm in AP and transverse dimension. There is a rind of intramural thrombus. There is no evidence of active extravasation. Again noted is moderate retroperitoneal hemorrhage adjacent to the aortic aneurysm likely from recent rupture which is now contained. Sided fracture may be over the right lateral wall of the aneurysm with there is subtle wide mouth projection of the lumen. Aneurysm extends to the level of the bifurcation. Celiac: Focal stenosis at the origin as the celiac axis is otherwise patent. SMA: Patent. Renals: Moderate calcified plaque at the origin and proximal renal arteries which are otherwise patent. Likely mild focal stenosis at the origin of the right renal artery. IMA: Patent. Inflow: No significant focal stenosis or occlusion. Calcified plaque over the common iliac arteries. Veins: Unremarkable. Review of the MIP images confirms the above findings. NON-VASCULAR Hepatobiliary: Liver, gallbladder and biliary tree are normal. Pancreas: Normal. Spleen: Normal. Adrenals/Urinary Tract: Adrenal glands are normal. Kidneys are normal in size without hydronephrosis or nephrolithiasis. Ureters and bladder are unremarkable. The distal ureters are obscured by streak artifact from right hip prosthesis. Stomach/Bowel: Stomach and small bowel are normal. Appendix is normal. Moderate colonic diverticulosis most prominent over the sigmoid colon. Lymphatic: No adenopathy. Reproductive: Previous hysterectomy. Other: Mild hemorrhage over the pelvis. Musculoskeletal: Degenerative change of the spine  with multilevel disc disease over the lumbar spine. Degenerative change of the left hip. Right hip prosthesis intact. Review of the MIP images confirms the above findings. IMPRESSION: 1. Infrarenal abdominal aortic aneurysm measuring 5.5 x 6 cm in AP and transverse dimension and extending to the bifurcation. Evidence of recent aneurysm rupture which is now contained. No active extravasation. 2. No evidence of aneurysm or dissection involving the thoracic aorta. 3. Aortic Atherosclerosis (ICD10-I70.0). Mild focal stenosis at the origin of the celiac axis as well as origin of the right renal artery. 4. Mild patchy peripheral multifocal airspace process compatible with multifocal pneumonia in this COVID-19 positive patient. 5.  Mild cardiomegaly.  Atherosclerotic coronary artery disease. 6.  Colonic diverticulosis. 7.  Mild compression deformities of T3 and T6 age indeterminate. Critical Value/emergent results were called by telephone at the time of interpretation on 05/02/2019 at 10:11 am to providerKEVIN PADUCHOWSKI , who verbally acknowledged these results. Electronically Signed   By: Elberta Fortis M.D.   On: 04/24/2019 10:12        Indwelling Urinary Catheter continued, requirement due to   Reason to continue Indwelling Urinary Catheter strict Intake/Output monitoring for hemodynamic instability         Ventilator continued, requirement due to severe respiratory failure   Ventilator Sedation RASS 0 to -2      ASSESSMENT AND PLAN SYNOPSIS  86 yo with previous COVID 19 infection now with acute ruptured AAA with endovascular repair with post op acidosis and severe hypoxic resp failure and severe hypovolumic and cardiogenic shock  Severe ACUTE Hypoxic and Hypercapnic Respiratory Failure -continue Full MV support -continue Bronchodilator Therapy -Wean Fio2 and PEEP as tolerated   SEVER ACIDOSIS START BICARB INFUSION  NEUROLOGY - intubated and sedated - minimal sedation to achieve a  RASS goal: -1  SHOCK-HYPOVOLUMIC/CARDIOGENIC -use vasopressors to keep MAP>65 -follow ABG and LA -aggressive IV fluid resuscitation  CARDIAC ICU monitoring  ID -continue IV abx as prescibed -follow up cultures  GI GI PROPHYLAXIS as indicated  NUTRITIONAL STATUS DIET-->NPO Constipation protocol as indicated   ENDO - will use ICU hypoglycemic\Hyperglycemia protocol if needed    ELECTROLYTES -follow labs as needed -replace as needed -pharmacy consultation and following   DVT/GI PRX ordered TRANSFUSIONS AS NEEDED MONITOR FSBS ASSESS the need for LABS    Critical Care Time devoted to patient care services described in this note is 35 minutes.   Overall, patient is critically ill, prognosis is guarded.  Patient with Multiorgan failure and at high risk for cardiac arrest and death.    PAIENT IS DNR  Lucie Leather, M.D.  [facility]  Medical Director [facility] Medical Director [facility] Cardio-Pulmonary Department

## 2019-05-11 NOTE — ED Notes (Signed)
Patient taken to OR by orderly Josh and RN at this time

## 2019-05-11 NOTE — Anesthesia Procedure Notes (Addendum)
Procedure Name: Intubation Date/Time: 2019/06/04 11:21 AM Performed by: Aline Brochure, CRNA Pre-anesthesia Checklist: Patient identified, Emergency Drugs available, Suction available and Patient being monitored Patient Re-evaluated:Patient Re-evaluated prior to induction Oxygen Delivery Method: Circle system utilized Preoxygenation: Pre-oxygenation with 100% oxygen Induction Type: IV induction, Rapid sequence and Cricoid Pressure applied Laryngoscope Size: McGraph and 3 Grade View: Grade I Tube type: Oral Tube size: 7.0 mm Number of attempts: 1 Airway Equipment and Method: Stylet and Video-laryngoscopy Placement Confirmation: ETT inserted through vocal cords under direct vision,  positive ETCO2 and breath sounds checked- equal and bilateral Secured at: 21 cm Tube secured with: Tape Dental Injury: Teeth and Oropharynx as per pre-operative assessment

## 2019-05-11 NOTE — ED Triage Notes (Addendum)
Arrived by EMS from [facility] independent. Diagnosed with COVID 04/19/19. Reports being in COVID part of [place] two weeks ago. HX diabetes and A-fib. C/o abdominal pain and staff reports that has been ongoing problem since being diagnosed with COVID. Patient also reports within the last week her right eye vision is not at baseline. She c/o some vision loss in right eye and is concerned for possible stroke. Recent right hip surgery 03/25/19

## 2019-05-11 NOTE — ED Notes (Signed)
Patient belongings sent with patient at bedside in belongings bag: grey purse, blue jeans, black socks, grey slippers, and blue sweater

## 2019-05-11 NOTE — ED Notes (Signed)
Patient transported to CT 

## 2019-05-11 NOTE — ED Provider Notes (Signed)
[facility] Emergency Department Provider Note  Time seen: 7:52 AM  I have reviewed the triage vital signs and the nursing notes.   HISTORY  Chief Complaint Abdominal Pain   HPI SHANTAI TIEDEMAN is a 86 y.o. female with a past medical history of COPD, diabetes, depression, A. fib, glaucoma, gastric reflux, hypertension, hyperlipidemia, presents to the emergency department for abdominal discomfort.  According to report  patient had Covid little over 2 weeks ago, have been intermittently complaining of abdominal pain since that time as well as increased visual deficits in the right eye.  Patient states over the past 2 to 3 days abdominal pain has worsened in the lower abdomen which is why she came to the emergency department today.  She did mention to the nurse that she was having some right eye changes, when I spoke to her regarding that she states she has been diagnosed with glaucoma in the eye and over the past 2 to 3 weeks she has been noticing some increasing visual disturbance which she describes as not being able to see as clearly in the right eye.  Denies any headache or eye pain.  Patient denies any dysuria, hematuria, nausea vomiting or diarrhea.  Past Medical History:  Diagnosis Date  . A-fib ([place])   . Bronchitis, obstructive, chronic ([place])   . COPD exacerbation ([place])   . Depression   . Diabetes ([place])    type II  . Esophageal reflux disease   . Fibromyalgia   . Glaucoma   . HBP (high blood pressure)   . Hypercholesterolemia   . Hypertension   . Memory loss     Patient Active Problem List   Diagnosis Date Noted  . Closed left hip fracture ([place]) 03/24/2019  . Pneumonia 09/02/2017    Past Surgical History:  Procedure Laterality Date  . HIP ARTHROPLASTY Right 03/25/2019   Procedure: ARTHROPLASTY BIPOLAR HIP (HEMIARTHROPLASTY);  Surgeon: Lovell Sheehan, MD;  Location: [facility] ORS;  Service: Orthopedics;  Laterality: Right;    Prior to Admission  medications   Medication Sig Start Date End Date Taking? Authorizing Provider  acetaminophen (TYLENOL) 500 MG tablet Take 1,000 mg by mouth every 8 (eight) hours as needed for mild pain.     [provider]  albuterol (PROVENTIL HFA;VENTOLIN HFA) 108 (90 Base) MCG/ACT inhaler Inhale 2 puffs into the lungs every 6 (six) hours as needed for wheezing or shortness of breath. 09/05/17   Loletha Grayer, MD  amiodarone (PACERONE) 200 MG tablet One tablet twice a day for seven days then one tablet daily 09/05/17   Loletha Grayer, MD  amLODipine (NORVASC) 5 MG tablet Take 5 mg by mouth daily.    [provider]  apixaban (ELIQUIS) 2.5 MG TABS tablet Take 1 tablet (2.5 mg total) by mouth 2 (two) times daily. 03/28/19   Caren Griffins, MD  Biotin 5 MG CAPS Take 1 capsule by mouth daily.    [provider]  brimonidine-timolol (COMBIGAN) 0.2-0.5 % ophthalmic solution Place 1 drop into the right eye 2 (two) times daily.    [provider]  Calcium Carbonate Antacid 400 MG CHEW Chew 400 mg by mouth daily as needed (stomach acid).     [provider]  Cholecalciferol 1000 units capsule Take 1,000 Units by mouth daily.     [provider]  diltiazem (CARDIZEM CD) 120 MG 24 hr capsule Take 1 capsule (120 mg total) by mouth daily. 03/28/19 03/27/20  Caren Griffins,  MD  escitalopram (LEXAPRO) 5 MG tablet Take 1 tablet by mouth daily. 08/26/17   [provider]  latanoprost (XALATAN) 0.005 % ophthalmic solution Place 1 drop into both eyes at bedtime.    [provider]  losartan (COZAAR) 100 MG tablet Take 100 mg by mouth daily.    [provider]  metFORMIN (GLUCOPHAGE) 500 MG tablet Take 500 mg by mouth 2 (two) times daily with a meal.    [provider]  mometasone-formoterol (DULERA) 100-5 MCG/ACT AERO Inhale 2 puffs into the lungs 2 (two) times daily. 09/05/17   Alford HighlandWieting, Richard, MD  rosuvastatin (CRESTOR) 5 MG tablet  Take 5 mg by mouth daily. 08/25/17   [provider]  tiotropium (SPIRIVA HANDIHALER) 18 MCG inhalation capsule Place 1 capsule (18 mcg total) into inhaler and inhale daily. 09/05/17 09/05/18  Alford HighlandWieting, Richard, MD  traZODone (DESYREL) 50 MG tablet Take 50 mg by mouth at bedtime as needed for sleep.     [provider]    Allergies  Allergen Reactions  . Penicillins     History reviewed. No pertinent family history.  Social History Social History   Tobacco Use  . Smoking status: Former Smoker    Types: Cigarettes  . Smokeless tobacco: Never Used  Substance Use Topics  . Alcohol use: No  . Drug use: No    Review of Systems Constitutional: Negative for fever. Cardiovascular: Negative for chest pain. Respiratory: Negative for shortness of breath. Gastrointestinal: Mild lower to mid abdominal pain.  Negative nausea vomiting or diarrhea. Genitourinary: Negative for urinary compaints Musculoskeletal: Negative for musculoskeletal complaints Neurological: Negative for headache All other ROS negative  ____________________________________________   PHYSICAL EXAM:  VITAL SIGNS: ED Triage Vitals  Enc Vitals Group     BP 04/27/2019 0715 (!) 154/88     Pulse Rate 04/11/2019 0715 95     Resp 04/14/2019 0715 14     Temp 04/12/2019 0715 98.3 F (36.8 C)     Temp Source 04/24/2019 0715 Oral     SpO2 05/07/2019 0715 96 %     Weight 05/02/2019 0717 125 lb (56.7 kg)     Height 04/30/2019 0717 5\' 2"  (1.575 m)     Head Circumference --      Peak Flow --      Pain Score 04/28/2019 0717 4     Pain Loc --      Pain Edu? --      Excl. in GC? --    Constitutional: Alert and oriented. Well appearing and in no distress. Eyes: Normal exam ENT      Head: Normocephalic and atraumatic.      Mouth/Throat: Mucous membranes are moist. Cardiovascular: Normal rate, regular rhythm.  Respiratory: Normal respiratory effort without tachypnea nor retractions. Breath sounds are clear   Gastrointestinal: Soft, mild diffuse tenderness without specific focal tenderness identified.  No rebound guarding or distention. Musculoskeletal: Nontender with normal range of motion in all extremities.  Neurologic:  Normal speech and language. No gross focal neurologic deficits  Skin:  Skin is warm, dry and intact.  Psychiatric: Mood and affect are normal.   ____________________________________________   RADIOLOGY  CT shows infrarenal AAA measuring 5.3 cm with evidence for rupture.  ____________________________________________   INITIAL IMPRESSION / ASSESSMENT AND PLAN / ED COURSE  Pertinent labs & imaging results that were available during my care of the patient were reviewed by me and considered in my medical decision making (see chart for details).   Patient  presents to the emergency department for abdominal discomfort intermittent over the past 2 to 3 weeks but somewhat worse over the past 2 to 3 days.  Has a secondary complaint and review of systems patient also states some decreased vision in the right eye which she states is similar to her glaucoma but she feels that has been getting worse over the past several weeks.  We will have the patient follow-up with ophthalmology as an outpatient regarding the eye.  We will obtain a CT scan of the abdomen while in the emergency department to rule out intra-abdominal pathology.  Overall the patient appears quite well with an overall reassuring physical exam and vitals.  I reviewed the patient's Noncon CT appears concerning for possible new AAA with leak.  I have ordered a CTA.  Radiology has called regarding the patient's Noncon CT which appears consistent with leak/rupture.  CTA is pending, discussed patient with Dr. Evie Lacks.  I spoke to the patient's daughter regarding the findings and possible surgical treatments.  Dr. Evie Lacks has seen the patient, spoken to the daughter, and will be taking the patient emergently to the operating  room.  LELANIA BIA was evaluated in Emergency Department on 05/31/19 for the symptoms described in the history of present illness. She was evaluated in the context of the global COVID-19 pandemic, which necessitated consideration that the patient might be at risk for infection with the SARS-CoV-2 virus that causes COVID-19. Institutional protocols and algorithms that pertain to the evaluation of patients at risk for COVID-19 are in a state of rapid change based on information released by regulatory bodies including the CDC and federal and state organizations. These policies and algorithms were followed during the patient's care in the ED.  CRITICAL CARE Performed by: Minna Antis   Total critical care time: 30 minutes  Critical care time was exclusive of separately billable procedures and treating other patients.  Critical care was necessary to treat or prevent imminent or life-threatening deterioration.  Critical care was time spent personally by me on the following activities: development of treatment plan with patient and/or surrogate as well as nursing, discussions with consultants, evaluation of patient's response to treatment, examination of patient, obtaining history from patient or surrogate, ordering and performing treatments and interventions, ordering and review of laboratory studies, ordering and review of radiographic studies, pulse oximetry and re-evaluation of patient's condition.   ____________________________________________   FINAL CLINICAL IMPRESSION(S) / ED DIAGNOSES  Ruptured AAA    Minna Antis, MD May 31, 2019 1007

## 2019-05-11 NOTE — Death Summary Note (Signed)
DEATH SUMMARY   Patient Details  Name: Denise Cervantes MRN: [id number] DOB: May 30, 1925  Admission/Discharge Information   Admit Date:  2018/12/10  Date of Death:   2018/12/10   Time of Death:  1547  Length of Stay: 0  Referring Physician: Marisue IvanLinthavong, Kanhka, MD   Reason(s) for Hospitalization  RUPTURED AAA  Diagnoses  Preliminary cause of death: RUPTURED AAA, DM Secondary Diagnoses (including complications and co-morbidities):  Active Problems:   AAA (abdominal aortic aneurysm, ruptured) (HCC)   Acute respiratory failure with hypoxia [facility](HCC)   Brief Hospital Course (including significant findings, care, treatment, and services provided and events leading to death)  Patient came to ER 2-3 days abd pain Found to have ruptured AAA Taken to OR emergently  PROCEDURE: 1. US guidance for vascular access, bilateral femoral arteries 2. Catheter placement into aorta from bilateral femoral approaches 3. Placement of an aortic balloon proximal to the visceral vessels for blood pressure support using a large compliant Coda balloon 4. Placement of a23 mm x 12 cmGore Excluder Endoprosthesis main body leftwith a 12 mm x 10 cm rightcontralateral limb 5. ProGlide closure devices bilateral femoral arteries  PRE-OPERATIVE DIAGNOSIS:RupturedAAAwith hemodynamic instability    POST OP RESP FAILURE POST OP HYPOVOLUMIC SHOCK  PATIENT IS DNR  POOR PROGNOSIS    Clinical status relayed to family  Updated and notified of patients medical condition-  Progressive multiorgan failure with very low chance of meaningful recovery.  Patient is in dying  Process.  Family understands the situation.  They have consented and agreed to DNR/DNI and would like to proceed with Comfort care measures.   Family are satisfied with Plan of action and management. All questions answered     Pertinent Labs and Studies  Significant Diagnostic Studies PERIPHERAL VASCULAR  CATHETERIZATION  Result Date: 2018/12/10 See op note  DG C-Arm 1-60 Min-No Report  Result Date: 2018/12/10 Fluoroscopy was utilized by the requesting physician.  No radiographic interpretation.   CT Renal Stone Study  Result Date: 2018/12/10 CLINICAL DATA:  Abdominal pain.  COVID-19 positive. EXAM: CT ABDOMEN AND PELVIS WITHOUT CONTRAST TECHNIQUE: Multidetector CT imaging of the abdomen and pelvis was performed following the standard protocol without IV contrast. COMPARISON:  None. FINDINGS: Lower chest: Mild peripheral interstitial changes over the lung bases. No airspace consolidation or effusion. Calcification of the mitral valve annulus. Calcification over the right coronary artery as well as calcification over the descending thoracic aorta. Hepatobiliary: Liver, gallbladder and biliary tree are normal. Pancreas: Normal. Spleen: Normal. Adrenals/Urinary Tract: Adrenal glands are normal. Kidneys are normal in size without hydronephrosis or nephrolithiasis. Ureters and bladder are normal. Distal ureters are partially obscured by streak artifact from right hip prosthesis. Stomach/Bowel: Stomach is normal. Small bowel is unremarkable. Appendix is normal. Moderate diverticulosis of the colon. Vascular/Lymphatic: There is calcified plaque over the abdominal aorta. There is aneurysmal dilatation of the infrarenal abdominal aorta beginning immediately below the renal arteries measuring 5.3 cm in AP diameter. Aneurysm extends to the bifurcation. There is moderate increased density material adjacent to the distal abdominal aorta likely due to rupture of patient's aortic aneurysm. This area of hemorrhage measures approximately 5.7 x 12.4 cm. No adenopathy. Reproductive: Previous hysterectomy. Other: Mild free fluid/hemorrhage in the pelvis. Musculoskeletal: Degenerative change of the spine. Right hip prosthesis intact. IMPRESSION: 1. Infrarenal abdominal aortic aneurysm measuring 5.3 cm in AP diameter with  evidence of acute rupture. Adjacent collection hemorrhage measuring approximately 5.7 x 12.4 cm. 2.  Colonic diverticulosis. 3. Aortic Atherosclerosis (ICD10-I70.0). Aortic  Atherosclerosis (ICD10-I70.0). Atherosclerotic coronary artery disease. Critical Value/emergent results were called by telephone at the time of interpretation on 04/27/2019 at 9:05 a.m. to providerKEVIN PADUCHOWSKI , who verbally acknowledged these results. Electronically Signed   By: Elberta Fortis M.D.   On: 05/04/2019 09:17   CT Angio Chest/Abd/Pel for Dissection W and/or Wo Contrast  Result Date: 04/30/2019 CLINICAL DATA:  COVID-19 positive. Recent noncontrast abdominal CT demonstrates abdominal aortic aneurysm repair. EXAM: CT ANGIOGRAPHY CHEST, ABDOMEN AND PELVIS TECHNIQUE: Multidetector CT imaging through the chest, abdomen and pelvis was performed using the standard protocol during bolus administration of intravenous contrast. Multiplanar reconstructed images and MIPs were obtained and reviewed to evaluate the vascular anatomy. CONTRAST:  OMNIPAQUE IOHEXOL 350 MG/ML SOLN COMPARISON:  None. FINDINGS: CTA CHEST FINDINGS Cardiovascular: Mild cardiomegaly. Calcification over the mitral valve annulus. There is calcified plaque over the 3 vessel coronary arteries as well as calcified plaque over the thoracic aorta. The thoracic aorta measures 3.2 cm in AP diameter. Mild mural thrombus over the proximal descending thoracic aorta. Pulmonary arterial system is unremarkable. Remaining vascular structures are unremarkable. Mediastinum/Nodes: 1.2 cm precarinal lymph node and adjacent 1.1 cm precarinal lymph node. These are likely reactive. No mediastinal adenopathy. Remaining mediastinal structures are unremarkable. Lungs/Pleura: Lungs are adequately inflated demonstrate patchy bilateral peripheral airspace process likely due to multifocal pneumonia this patient who is COVID-19 positive. No effusion. Airways are normal. Musculoskeletal:  Degenerative change of the spine. Mild compression form is of T3 and T6. Review of the MIP images confirms the above findings. CTA ABDOMEN AND PELVIS FINDINGS VASCULAR Aorta: Moderate calcified plaque over the abdominal aorta. Aneurysmal dilatation of the infrarenal abdominal aorta measuring 5.5 x 6 cm in AP and transverse dimension. There is a rind of intramural thrombus. There is no evidence of active extravasation. Again noted is moderate retroperitoneal hemorrhage adjacent to the aortic aneurysm likely from recent rupture which is now contained. Sided fracture may be over the right lateral wall of the aneurysm with there is subtle wide mouth projection of the lumen. Aneurysm extends to the level of the bifurcation. Celiac: Focal stenosis at the origin as the celiac axis is otherwise patent. SMA: Patent. Renals: Moderate calcified plaque at the origin and proximal renal arteries which are otherwise patent. Likely mild focal stenosis at the origin of the right renal artery. IMA: Patent. Inflow: No significant focal stenosis or occlusion. Calcified plaque over the common iliac arteries. Veins: Unremarkable. Review of the MIP images confirms the above findings. NON-VASCULAR Hepatobiliary: Liver, gallbladder and biliary tree are normal. Pancreas: Normal. Spleen: Normal. Adrenals/Urinary Tract: Adrenal glands are normal. Kidneys are normal in size without hydronephrosis or nephrolithiasis. Ureters and bladder are unremarkable. The distal ureters are obscured by streak artifact from right hip prosthesis. Stomach/Bowel: Stomach and small bowel are normal. Appendix is normal. Moderate colonic diverticulosis most prominent over the sigmoid colon. Lymphatic: No adenopathy. Reproductive: Previous hysterectomy. Other: Mild hemorrhage over the pelvis. Musculoskeletal: Degenerative change of the spine with multilevel disc disease over the lumbar spine. Degenerative change of the left hip. Right hip prosthesis intact. Review of  the MIP images confirms the above findings. IMPRESSION: 1. Infrarenal abdominal aortic aneurysm measuring 5.5 x 6 cm in AP and transverse dimension and extending to the bifurcation. Evidence of recent aneurysm rupture which is now contained. No active extravasation. 2. No evidence of aneurysm or dissection involving the thoracic aorta. 3. Aortic Atherosclerosis (ICD10-I70.0). Mild focal stenosis at the origin of the celiac axis as well as origin  of the right renal artery. 4. Mild patchy peripheral multifocal airspace process compatible with multifocal pneumonia in this COVID-19 positive patient. 5.  Mild cardiomegaly.  Atherosclerotic coronary artery disease. 6.  Colonic diverticulosis. 7.  Mild compression deformities of T3 and T6 age indeterminate. Critical Value/emergent results were called by telephone at the time of interpretation on 04/26/2019 at 10:11 am to providerKEVIN PADUCHOWSKI , who verbally acknowledged these results. Electronically Signed   By: Elberta Fortis M.D.   On: 05/10/2019 10:12    Microbiology Recent Results (from the past 240 hour(s))  Respiratory Panel by RT PCR (Flu A&B, Covid) - Nasopharyngeal Swab     Status: Abnormal   Collection Time: 04/12/2019 10:10 AM   Specimen: Nasopharyngeal Swab  Result Value Ref Range Status   SARS Coronavirus 2 by RT PCR POSITIVE (A) NEGATIVE Final    Comment: RESULT CALLED TO, READ BACK BY AND VERIFIED WITH: [facility] MOORE AT 1110 04/21/2019.PMF (NOTE) SARS-CoV-2 target nucleic acids are DETECTED. SARS-CoV-2 RNA is generally detectable in upper respiratory specimens  during the acute phase of infection. Positive results are indicative of the presence of the identified virus, but do not rule out bacterial infection or co-infection with other pathogens not detected by the test. Clinical correlation with patient history and other diagnostic information is necessary to determine patient infection status. The expected result is Negative. Fact Sheet  for Patients:  https://www.moore.com/ Fact Sheet for Healthcare Providers: https://www.young.biz/ This test is not yet approved or cleared by the [place] FDA and  has been authorized for detection and/or diagnosis of SARS-CoV-2 by FDA under an Emergency Use Authorization (EUA).  This EUA will remain in effect (meaning this test can be used) f or the duration of  the COVID-19 declaration under Section 564(b)(1) of the Act, 21 U.S.C. section 360bbb-3(b)(1), unless the authorization is terminated or revoked sooner.    Influenza A by PCR NEGATIVE NEGATIVE Final   Influenza B by PCR NEGATIVE NEGATIVE Final    Comment: (NOTE) The Xpert Xpress SARS-CoV-2/FLU/RSV assay is intended as an aid in  the diagnosis of influenza from Nasopharyngeal swab specimens and  should not be used as a sole basis for treatment. Nasal washings and  aspirates are unacceptable for Xpert Xpress SARS-CoV-2/FLU/RSV  testing. Fact Sheet for Patients: https://www.moore.com/ Fact Sheet for Healthcare Providers: https://www.young.biz/ This test is not yet approved or cleared by the [place] FDA and  has been authorized for detection and/or diagnosis of SARS-CoV-2 by  FDA under an Emergency Use Authorization (EUA). This EUA will remain  in effect (meaning this test can be used) for the duration of the  Covid-19 declaration under Section 564(b)(1) of the Act, 21  U.S.C. section 360bbb-3(b)(1), unless the authorization is  terminated or revoked. Performed at [facility], [address] [place], [place] [address]   MRSA PCR Screening     Status: None   Collection Time: 05/03/2019  1:05 PM   Specimen: Nasopharyngeal  Result Value Ref Range Status   MRSA by PCR NEGATIVE NEGATIVE Final    Comment:        The GeneXpert MRSA Assay (FDA approved for NASAL specimens only), is one component of a comprehensive MRSA  colonization surveillance program. It is not intended to diagnose MRSA infection nor to guide or monitor treatment for MRSA infections. Performed at [facility], [address] [place], [place] [address]     Lab Basic Metabolic Panel: Recent Labs  Lab 05/02/2019 0729  NA 132*  K 3.2*  CL 97*  CO2 21*  GLUCOSE 246*  BUN 9  CREATININE 0.59  CALCIUM 8.0*   Liver Function Tests: Recent Labs  Lab 05/02/2019 0729  AST 18  ALT 13  ALKPHOS 67  BILITOT 0.8  PROT 6.4*  ALBUMIN 2.3*   Recent Labs  Lab 04/25/2019 0729  LIPASE 35   No results for input(s): AMMONIA in the last 168 hours. CBC: Recent Labs  Lab 05/08/2019 0729  WBC 16.4*  NEUTROABS 13.7*  HGB 9.4*  HCT 27.9*  MCV 85.3  PLT 333   Cardiac Enzymes: No results for input(s): CKTOTAL, CKMB, CKMBINDEX, TROPONINI in the last 168 hours. Sepsis Labs: Recent Labs  Lab 04/30/2019 0729  WBC 16.4*      Surabhi Gadea 05/06/2019, 4:02 PM

## 2019-05-11 NOTE — Progress Notes (Signed)
Clinical status relayed to family(son Richardson Landry)  Updated and notified of patients medical condition-  Progressive multiorgan failure with very low chance of meaningful recovery.  Patient is in dying  Process.  He understands the situation.  They have consented and agreed to DNR Status  Family are satisfied with Plan of action and management. All questions answered  Awaiting for further family to arrive  Corrin Parker, M.D.  [facility]  Medical Director [place] Director [facility] Cardio-Pulmonary Department

## 2019-05-11 NOTE — Transfer of Care (Signed)
Immediate Anesthesia Transfer of Care Note  Patient: Denise Cervantes  Procedure(s) Performed: ANEURYSM ABDOMINAL AORTIC REPAIR (N/A )  Patient Location: ICU  Anesthesia Type:General  Level of Consciousness: Patient remains intubated per anesthesia plan  Airway & Oxygen Therapy: Patient remains intubated per anesthesia plan  Post-op Assessment: Post -op Vital signs reviewed and unstable, Anesthesiologist notified  Post vital signs: unstable  Last Vitals:  Vitals Value Taken Time  BP 60/37 04/27/2019 1305  Temp    Pulse 72 04/30/2019 1306  Resp 26 05/06/2019 1311  SpO2 89 % 05/10/2019 1306  Vitals shown include unvalidated device data.  Last Pain:  Vitals:   05/06/2019 1225  TempSrc: Axillary  PainSc:          Complications: No apparent anesthesia complications

## 2019-05-11 NOTE — Progress Notes (Signed)
Pt made comfort care by Dr Mortimer Fries after he spoke with family, pressors DCd, pt extubated, was already bradying down before we extubated, she was pronounced dead by writing RN and Kathreen Cornfield, RN at [phone], CDS notified, pt is not a donor candidate, family notified, forms completed and body prepared for transport, her belongings including rings on her left hand will be picked up by family

## 2019-05-11 NOTE — Plan of Care (Signed)
Son has visited, pt extreme hypotension despite pressors, unresponsive since OR brought her to room 3, she will remain NO CODE per son.  Daughter is also coming to visit

## 2019-05-11 NOTE — Anesthesia Preprocedure Evaluation (Addendum)
Anesthesia Evaluation  Patient identified by MRN, date of birth, ID band Patient awake    Reviewed: Allergy & Precautions, H&P , NPO status , Patient's Chart, lab work & pertinent test results, reviewed documented beta blocker date and time , Unable to perform ROS - Chart review onlyPreop documentation limited or incomplete due to emergent nature of procedure.  History of Anesthesia Complications Negative for: history of anesthetic complications  Airway   TM Distance: >3 FB Neck ROM: limited   Comment: Patient unable to comply with exam. Dental  (+) Dental Advidsory Given   Pulmonary COPD, former smoker,           Cardiovascular Exercise Tolerance: Good hypertension, + Peripheral Vascular Disease (Ruptured AAA)       Neuro/Psych PSYCHIATRIC DISORDERS Depression  Neuromuscular disease (fibromyalgia)    GI/Hepatic Neg liver ROS, GERD  ,  Endo/Other  diabetes  Renal/GU negative Renal ROS  negative genitourinary   Musculoskeletal   Abdominal   Peds  Hematology negative hematology ROS (+)   Anesthesia Other Findings Past Medical History: No date: A-fib (HCC) No date: Bronchitis, obstructive, chronic (HCC) No date: COPD exacerbation (HCC) No date: Depression No date: Diabetes ([place])     Comment:  type II No date: Esophageal reflux disease No date: Fibromyalgia No date: Glaucoma No date: HBP (high blood pressure) No date: Hypercholesterolemia No date: Hypertension No date: Memory loss  Patient with multiple medical problems presenting with a ruptured AAA here for repair.  Per the OR nurse patient was doing well (A&Ox3) until they got into the elevator to come up to the OR.  At that time she started complaining of nausea and "didn't feel right".  On arrival to the OR she was still complaining of nausea and after we got her onto the OR bed she became unresponsive with a low blood pressure.  I obtained consent from the  daughter after we had started the case as patient needed to be repaired emergently and was acutely unstable.  Reproductive/Obstetrics negative OB ROS                            Anesthesia Physical Anesthesia Plan  ASA: V and emergent  Anesthesia Plan: General   Post-op Pain Management:    Induction: Intravenous, Rapid sequence and Cricoid pressure planned  PONV Risk Score and Plan: 3 and Treatment may vary due to age or medical condition  Airway Management Planned: Oral ETT  Additional Equipment:   Intra-op Plan:   Post-operative Plan: Post-operative intubation/ventilation  Informed Consent: I have reviewed the patients History and Physical, chart, labs and discussed the procedure including the risks, benefits and alternatives for the proposed anesthesia with the patient or authorized representative who has indicated his/her understanding and acceptance.     Dental Advisory Given  Plan Discussed with: Anesthesiologist, CRNA and Surgeon  Anesthesia Plan Comments:        Anesthesia Quick Evaluation

## 2019-05-11 NOTE — Anesthesia Post-op Follow-up Note (Signed)
Anesthesia QCDR form completed.        

## 2019-05-11 DEATH — deceased

## 2019-06-11 DEATH — deceased

## 2020-01-30 IMAGING — CT CT ANGIO CHEST-ABD-PELV FOR DISSECTION W/ AND WO/W CM
2 of 7 series · 12 of 36 positions shown, 15 images · IV contrast (omnipaque)
Comparison: None.

CLINICAL DATA: DG1FZ-0F positive. Recent noncontrast abdominal CT
demonstrates abdominal aortic aneurysm repair.

EXAM:
CT ANGIOGRAPHY CHEST, ABDOMEN AND PELVIS
TECHNIQUE: Multidetector CT imaging through the chest, abdomen and pelvis was
performed using the standard protocol during bolus administration of
intravenous contrast. Multiplanar reconstructed images and MIPs were
obtained and reviewed to evaluate the vascular anatomy.
CONTRAST:  100mL OMNIPAQUE IOHEXOL 350 MG/ML SOLN

[Series 4: axial arterial · axial · arterial · 0.72mm/px · z∈[-366,+124]mm · 10 of 299 slices shown, 13 images]
[im 36/299  mediastinal]
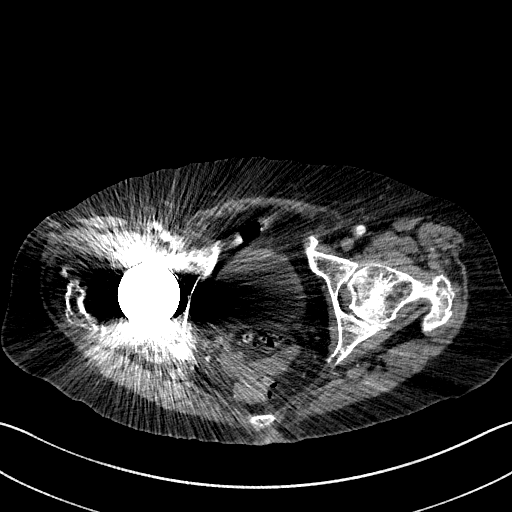
[im 36/299  bone]
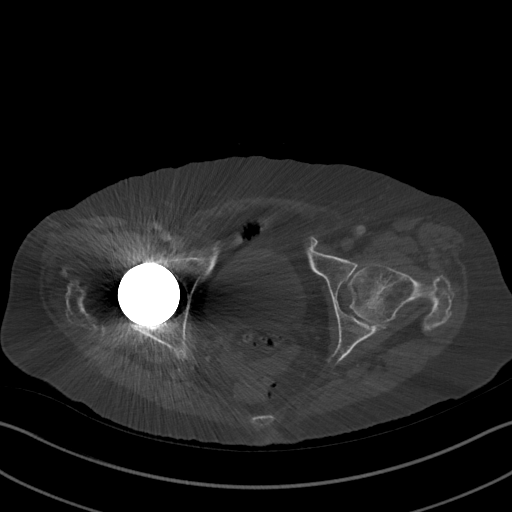
[im 71/299  mediastinal]
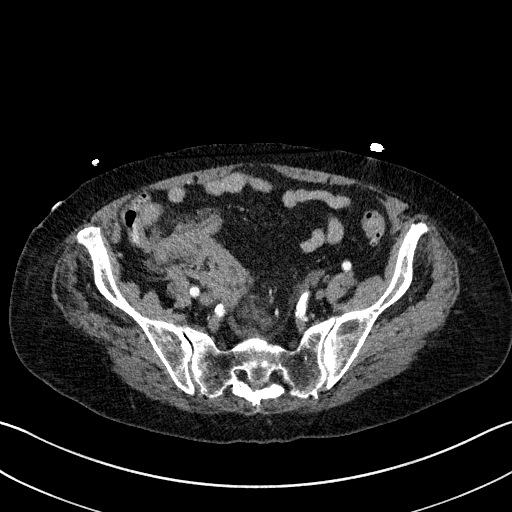
[im 106/299  mediastinal]
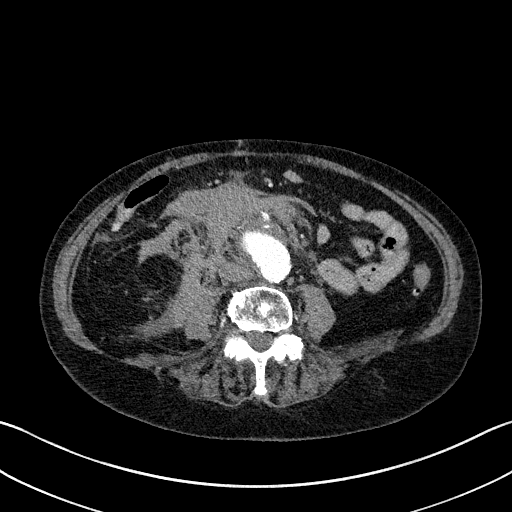
[im 141/299  mediastinal]
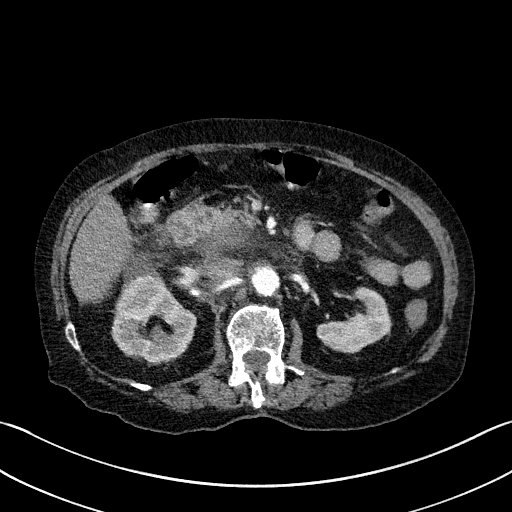
[im 176/299  mediastinal]
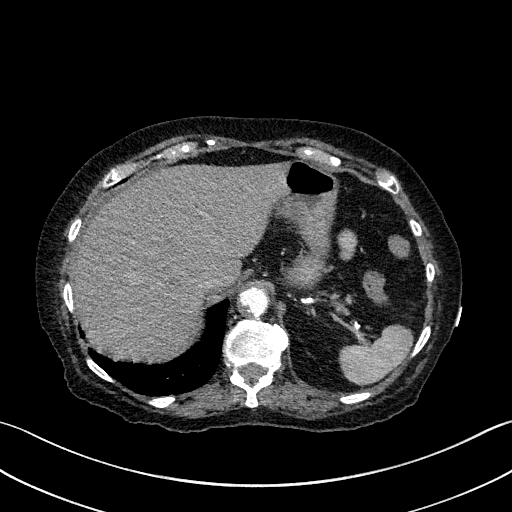
[im 211/299  mediastinal]
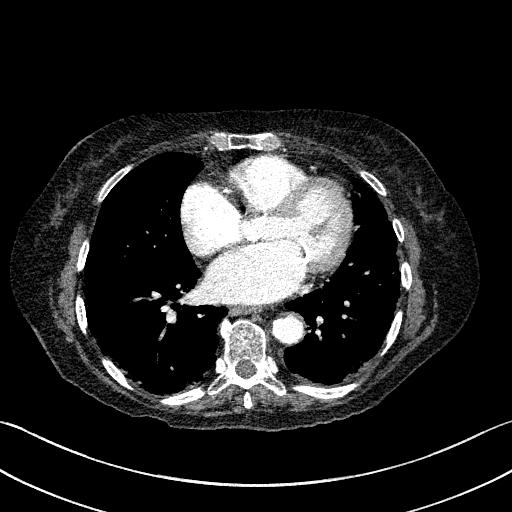
[im 228/299  lung]
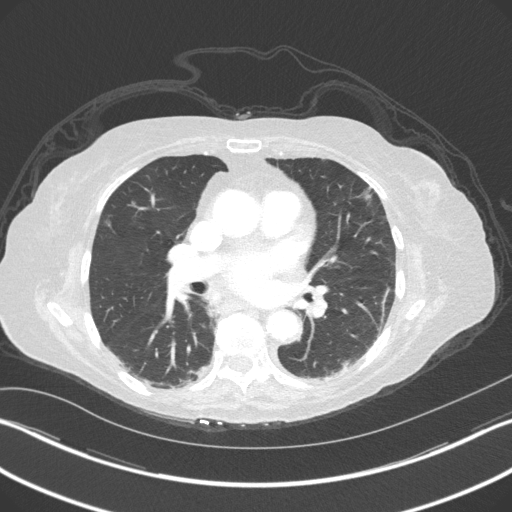
[im 246/299  mediastinal]
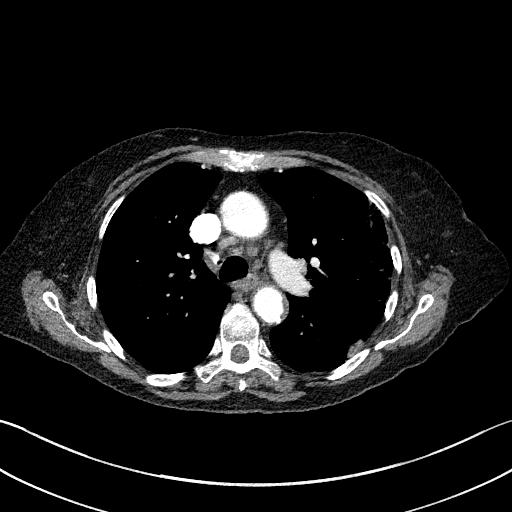
[im 246/299  lung]
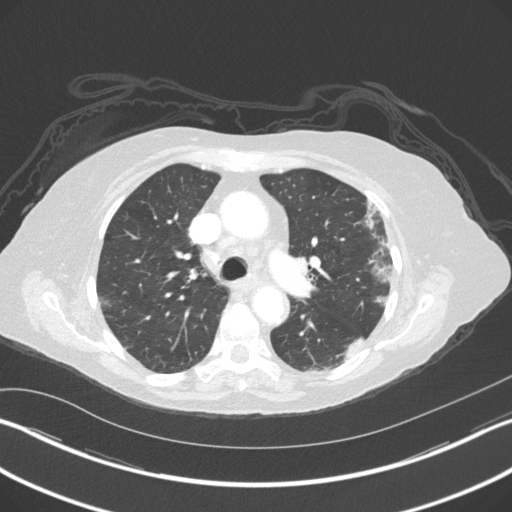
[im 263/299  lung]
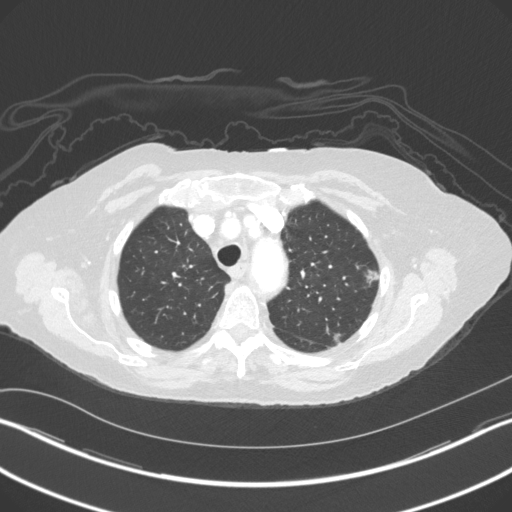
[im 281/299  mediastinal]
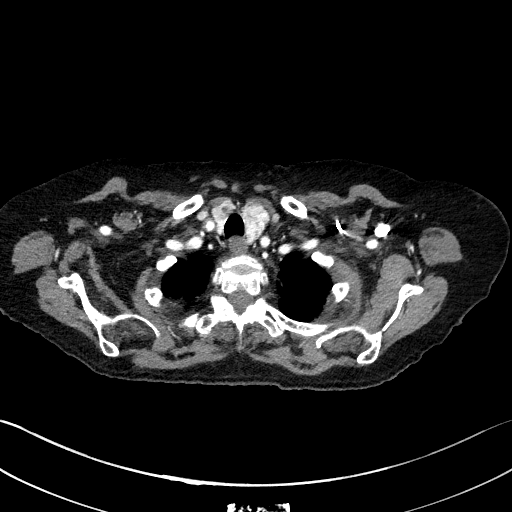
[im 281/299  lung]
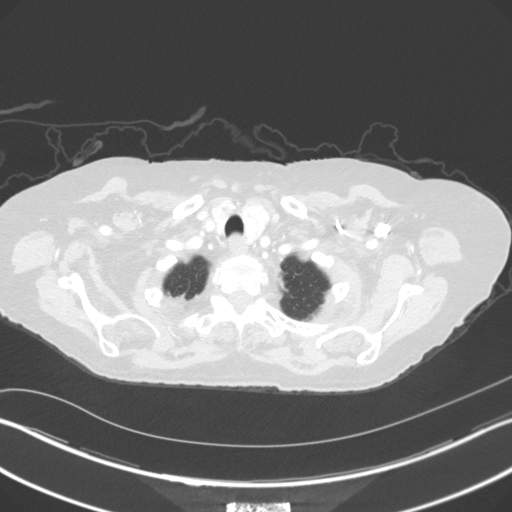

[Series 6: coronal mpr · coronal · 0.66mm/px · 2 of 130 slices shown]
[im 44/130  mediastinal]
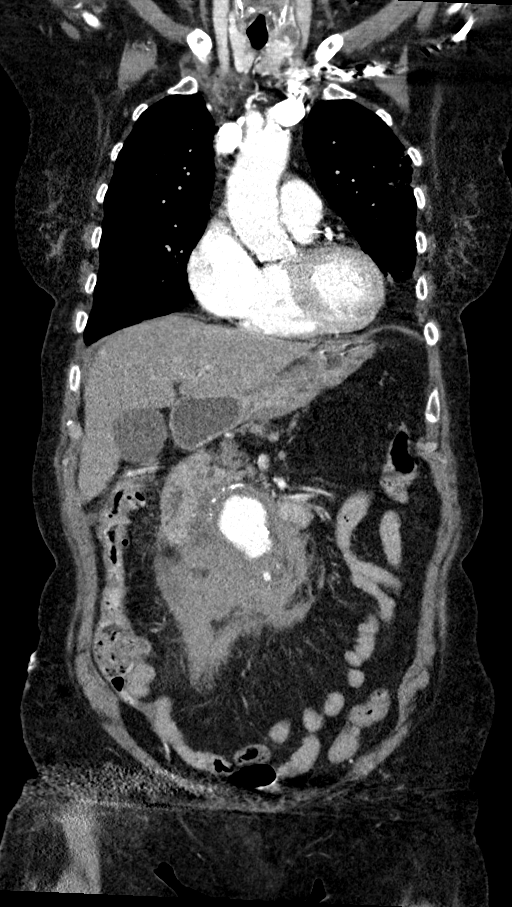
[im 87/130  mediastinal]
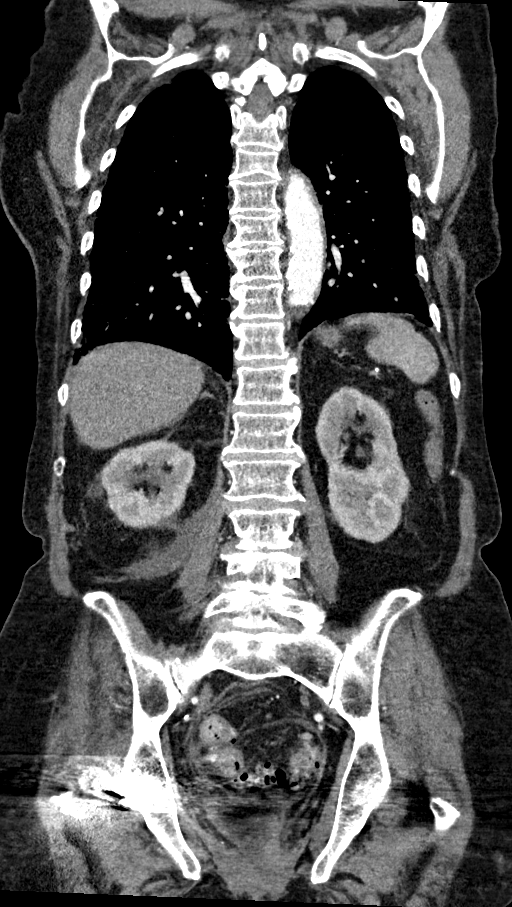

[12 of 36 positions shown; findings below may reference images not displayed]

FINDINGS: CTA CHEST FINDINGS

Cardiovascular: Mild cardiomegaly. Calcification over the mitral
valve annulus. There is calcified plaque over the 3 vessel coronary
arteries as well as calcified plaque over the thoracic aorta. The
thoracic aorta measures 3.2 cm in AP diameter. Mild mural thrombus
over the proximal descending thoracic aorta. Pulmonary arterial
system is unremarkable. Remaining vascular structures are
unremarkable.

Mediastinum/Nodes: 1.2 cm precarinal lymph node and adjacent 1.1 cm
precarinal lymph node. These are likely reactive. No mediastinal
adenopathy. Remaining mediastinal structures are unremarkable.

Lungs/Pleura: Lungs are adequately inflated demonstrate patchy
bilateral peripheral airspace process likely due to multifocal
pneumonia this patient who is DG1FZ-0F positive. No effusion.
Airways are normal.

Musculoskeletal: Degenerative change of the spine. Mild compression
form is of T3 and T6.

Review of the MIP images confirms the above findings.

CTA ABDOMEN AND PELVIS FINDINGS

VASCULAR

Aorta: Moderate calcified plaque over the abdominal aorta.
Aneurysmal dilatation of the infrarenal abdominal aorta measuring
5.5 x 6 cm in AP and transverse dimension. There is a rind of
intramural thrombus. There is no evidence of active extravasation.
Again noted is moderate retroperitoneal hemorrhage adjacent to the
aortic aneurysm likely from recent rupture which is now contained.
Sided fracture may be over the right lateral wall of the aneurysm
with there is subtle wide mouth projection of the lumen. Aneurysm
extends to the level of the bifurcation.

Celiac: Focal stenosis at the origin as the celiac axis is otherwise
patent.

SMA: Patent.

Renals: Moderate calcified plaque at the origin and proximal renal
arteries which are otherwise patent. Likely mild focal stenosis at
the origin of the right renal artery.

IMA: Patent.

Inflow: No significant focal stenosis or occlusion. Calcified plaque
over the common iliac arteries.

Veins: Unremarkable.

Review of the MIP images confirms the above findings.

NON-VASCULAR

Hepatobiliary: Liver, gallbladder and biliary tree are normal.

Pancreas: Normal.

Spleen: Normal.

Adrenals/Urinary Tract: Adrenal glands are normal. Kidneys are
normal in size without hydronephrosis or nephrolithiasis. Ureters
and bladder are unremarkable. The distal ureters are obscured by
streak artifact from right hip prosthesis.

Stomach/Bowel: Stomach and small bowel are normal. Appendix is
normal. Moderate colonic diverticulosis most prominent over the
sigmoid colon.

Lymphatic: No adenopathy.

Reproductive: Previous hysterectomy.

Other: Mild hemorrhage over the pelvis.

Musculoskeletal: Degenerative change of the spine with multilevel
disc disease over the lumbar spine. Degenerative change of the left
hip. Right hip prosthesis intact.

Review of the MIP images confirms the above findings.
IMPRESSION: 1. Infrarenal abdominal aortic aneurysm measuring 5.5 x 6 cm in AP
and transverse dimension and extending to the bifurcation. Evidence
of recent aneurysm rupture which is now contained. No active
extravasation.

2. No evidence of aneurysm or dissection involving the thoracic
aorta.

3. Aortic Atherosclerosis ([id number]). Mild focal stenosis at the
origin of the celiac axis as well as origin of the right renal
artery.

4. Mild patchy peripheral multifocal airspace process compatible
with multifocal pneumonia in this DG1FZ-0F positive patient.

5.  Mild cardiomegaly.  Atherosclerotic coronary artery disease.

6.  Colonic diverticulosis.

7.  Mild compression deformities of T3 and T6 age indeterminate.

Critical Value/emergent results were called by telephone at the time
VIVIANE , who verbally acknowledged these results.
